# Patient Record
Sex: Male | Born: 1965 | Race: White | Hispanic: No | Marital: Married | Smoking: Never smoker
Health system: Southern US, Community
[De-identification: ages and names within clinical notes are randomized; demographics above are authoritative.]

## PROBLEM LIST (undated history)

## (undated) DIAGNOSIS — N390 Urinary tract infection, site not specified: Secondary | ICD-10-CM

## (undated) DIAGNOSIS — K219 Gastro-esophageal reflux disease without esophagitis: Secondary | ICD-10-CM

## (undated) DIAGNOSIS — R7303 Prediabetes: Secondary | ICD-10-CM

## (undated) DIAGNOSIS — R011 Cardiac murmur, unspecified: Secondary | ICD-10-CM

## (undated) DIAGNOSIS — M1990 Unspecified osteoarthritis, unspecified site: Secondary | ICD-10-CM

## (undated) DIAGNOSIS — E782 Mixed hyperlipidemia: Secondary | ICD-10-CM

## (undated) HISTORY — DX: Urinary tract infection, site not specified: N39.0

## (undated) HISTORY — PX: HYPOSPADIAS CORRECTION: SHX483

## (undated) HISTORY — DX: Mixed hyperlipidemia: E78.2

---

## 2003-04-16 HISTORY — PX: LUMBAR DISC SURGERY: SHX700

## 2003-12-05 ENCOUNTER — Ambulatory Visit (HOSPITAL_COMMUNITY): Admission: RE | Admit: 2003-12-05 | Discharge: 2003-12-06 | Payer: Self-pay | Admitting: Neurosurgery

## 2017-04-12 DIAGNOSIS — J0140 Acute pansinusitis, unspecified: Secondary | ICD-10-CM | POA: Diagnosis not present

## 2017-04-12 DIAGNOSIS — J029 Acute pharyngitis, unspecified: Secondary | ICD-10-CM | POA: Diagnosis not present

## 2017-05-09 DIAGNOSIS — E291 Testicular hypofunction: Secondary | ICD-10-CM | POA: Diagnosis not present

## 2017-05-09 DIAGNOSIS — E782 Mixed hyperlipidemia: Secondary | ICD-10-CM | POA: Diagnosis not present

## 2017-05-19 DIAGNOSIS — N401 Enlarged prostate with lower urinary tract symptoms: Secondary | ICD-10-CM | POA: Diagnosis not present

## 2017-06-04 DIAGNOSIS — J111 Influenza due to unidentified influenza virus with other respiratory manifestations: Secondary | ICD-10-CM | POA: Diagnosis not present

## 2017-06-04 DIAGNOSIS — R509 Fever, unspecified: Secondary | ICD-10-CM | POA: Diagnosis not present

## 2017-07-21 DIAGNOSIS — N390 Urinary tract infection, site not specified: Secondary | ICD-10-CM | POA: Diagnosis not present

## 2017-09-10 DIAGNOSIS — J069 Acute upper respiratory infection, unspecified: Secondary | ICD-10-CM | POA: Diagnosis not present

## 2017-09-10 DIAGNOSIS — N390 Urinary tract infection, site not specified: Secondary | ICD-10-CM | POA: Diagnosis not present

## 2017-10-14 DIAGNOSIS — E291 Testicular hypofunction: Secondary | ICD-10-CM | POA: Diagnosis not present

## 2017-10-14 DIAGNOSIS — Z79899 Other long term (current) drug therapy: Secondary | ICD-10-CM | POA: Diagnosis not present

## 2017-10-14 DIAGNOSIS — E782 Mixed hyperlipidemia: Secondary | ICD-10-CM | POA: Diagnosis not present

## 2017-10-14 DIAGNOSIS — Z125 Encounter for screening for malignant neoplasm of prostate: Secondary | ICD-10-CM | POA: Diagnosis not present

## 2017-11-17 DIAGNOSIS — N3090 Cystitis, unspecified without hematuria: Secondary | ICD-10-CM | POA: Diagnosis not present

## 2018-01-08 DIAGNOSIS — R109 Unspecified abdominal pain: Secondary | ICD-10-CM | POA: Diagnosis not present

## 2018-01-08 DIAGNOSIS — R1084 Generalized abdominal pain: Secondary | ICD-10-CM | POA: Diagnosis not present

## 2018-01-08 DIAGNOSIS — R197 Diarrhea, unspecified: Secondary | ICD-10-CM | POA: Diagnosis not present

## 2018-01-08 DIAGNOSIS — K591 Functional diarrhea: Secondary | ICD-10-CM | POA: Diagnosis not present

## 2018-02-16 DIAGNOSIS — E782 Mixed hyperlipidemia: Secondary | ICD-10-CM | POA: Diagnosis not present

## 2018-02-16 DIAGNOSIS — Z6832 Body mass index (BMI) 32.0-32.9, adult: Secondary | ICD-10-CM | POA: Diagnosis not present

## 2018-02-16 DIAGNOSIS — Z23 Encounter for immunization: Secondary | ICD-10-CM | POA: Diagnosis not present

## 2018-02-16 DIAGNOSIS — E291 Testicular hypofunction: Secondary | ICD-10-CM | POA: Diagnosis not present

## 2018-04-28 DIAGNOSIS — Z79899 Other long term (current) drug therapy: Secondary | ICD-10-CM | POA: Diagnosis not present

## 2018-04-28 DIAGNOSIS — Z6832 Body mass index (BMI) 32.0-32.9, adult: Secondary | ICD-10-CM | POA: Diagnosis not present

## 2018-04-28 DIAGNOSIS — E782 Mixed hyperlipidemia: Secondary | ICD-10-CM | POA: Diagnosis not present

## 2018-04-28 DIAGNOSIS — M545 Low back pain: Secondary | ICD-10-CM | POA: Diagnosis not present

## 2018-06-08 DIAGNOSIS — N401 Enlarged prostate with lower urinary tract symptoms: Secondary | ICD-10-CM | POA: Diagnosis not present

## 2018-06-08 DIAGNOSIS — N3090 Cystitis, unspecified without hematuria: Secondary | ICD-10-CM | POA: Diagnosis not present

## 2018-06-08 DIAGNOSIS — N529 Male erectile dysfunction, unspecified: Secondary | ICD-10-CM | POA: Diagnosis not present

## 2018-08-05 DIAGNOSIS — M7742 Metatarsalgia, left foot: Secondary | ICD-10-CM | POA: Diagnosis not present

## 2018-08-05 DIAGNOSIS — Z6833 Body mass index (BMI) 33.0-33.9, adult: Secondary | ICD-10-CM | POA: Diagnosis not present

## 2019-06-17 ENCOUNTER — Encounter: Payer: Self-pay | Admitting: Cardiology

## 2019-06-17 DIAGNOSIS — E782 Mixed hyperlipidemia: Secondary | ICD-10-CM

## 2019-06-17 DIAGNOSIS — N390 Urinary tract infection, site not specified: Secondary | ICD-10-CM

## 2019-06-18 ENCOUNTER — Ambulatory Visit (INDEPENDENT_AMBULATORY_CARE_PROVIDER_SITE_OTHER): Payer: 59 | Admitting: Cardiology

## 2019-06-18 ENCOUNTER — Encounter: Payer: Self-pay | Admitting: Cardiology

## 2019-06-18 ENCOUNTER — Encounter (INDEPENDENT_AMBULATORY_CARE_PROVIDER_SITE_OTHER): Payer: Self-pay

## 2019-06-18 ENCOUNTER — Other Ambulatory Visit: Payer: Self-pay

## 2019-06-18 DIAGNOSIS — E782 Mixed hyperlipidemia: Secondary | ICD-10-CM

## 2019-06-18 DIAGNOSIS — I2510 Atherosclerotic heart disease of native coronary artery without angina pectoris: Secondary | ICD-10-CM

## 2019-06-18 DIAGNOSIS — R0789 Other chest pain: Secondary | ICD-10-CM

## 2019-06-18 HISTORY — DX: Atherosclerotic heart disease of native coronary artery without angina pectoris: I25.10

## 2019-06-18 NOTE — Patient Instructions (Signed)
Medication Instructions:  Your physician has recommended you make the following change in your medication:   Take 81 mg aspirin daily.  *If you need a refill on your cardiac medications before your next appointment, please call your pharmacy*   Lab Work: You need to have labs done when you are fasting.  You can come Monday through Friday 8:30 am to 12:00 pm and 1:15 to 4:30. You do not need to make an appointment as the order has already been placed. The labs you are going to have done are LFT and Lipids. If you have labs (blood work) drawn today and your tests are completely normal, you will receive your results only by: Marland Kitchen MyChart Message (if you have MyChart) OR . A paper copy in the mail If you have any lab test that is abnormal or we need to change your treatment, we will call you to review the results.   Testing/Procedures: Your physician has requested that you have an echocardiogram. Echocardiography is a painless test that uses sound waves to create images of your heart. It provides your doctor with information about the size and shape of your heart and how well your heart's chambers and valves are working. This procedure takes approximately one hour. There are no restrictions for this procedure. Your physician has requested that you have a lexiscan myoview. For further information please visit [email or web address] Please follow instruction sheet, as given.  Your physician has requested that you have a lexiscan myoview. For further information please visit [email or web address] Please follow instruction sheet, as given.  The test will take approximately 3 to 4 hours to complete; you may bring reading material.  If someone comes with you to your appointment, they will need to remain in the main lobby due to limited space in the testing area. **If you are pregnant or breastfeeding, please notify the nuclear lab prior to your appointment**  How to prepare for your Myocardial Perfusion  Test: . Do not eat or drink 3 hours prior to your test, except you may have water. . Do not consume products containing caffeine (regular or decaffeinated) 12 hours prior to your test. (ex: coffee, chocolate, sodas, tea). . Do bring a list of your current medications with you.  If not listed below, you may take your medications as normal. . Do wear comfortable clothes (no dresses or overalls) and walking shoes, tennis shoes preferred (No heels or open toe shoes are allowed). . Do NOT wear cologne, perfume, aftershave, or lotions (deodorant is allowed). . If these instructions are not followed, your test will have to be rescheduled.    Follow-Up: At [facility], you and your health needs are our priority.  As part of our continuing mission to provide you with exceptional heart care, we have created designated Provider Care Teams.  These Care Teams include your primary Cardiologist (physician) and Advanced Practice Providers (APPs -  Physician Assistants and Nurse Practitioners) who all work together to provide you with the care you need, when you need it.  We recommend signing up for the patient portal called "MyChart".  Sign up information is provided on this After Visit Summary.  MyChart is used to connect with patients for Virtual Visits (Telemedicine).  Patients are able to view lab/test results, encounter notes, upcoming appointments, etc.  Non-urgent messages can be sent to your provider as well.   To learn more about what you can do with MyChart, go to [email or web address].    Your next appointment:   2 month(s)  The format for your next appointment:   In Person  Provider:   Jyl Heinz, MD   Other Instructions NA

## 2019-06-18 NOTE — Progress Notes (Signed)
Cardiology Office Note:    Date:  06/18/2019   ID:  Ethan Chapman, DOB 03/23/1966, MRN [id number]  PCP:  Lise Auer, MD  Cardiologist:  Garwin Brothers, MD   Referring MD: Lise Auer, MD    ASSESSMENT:    1. Mixed hypercholesterolemia and hypertriglyceridemia   2. Atherosclerosis of native coronary artery of native heart without angina pectoris   3. Chest tightness    PLAN:    In order of problems listed above:  1. Coronary atherosclerosis: Secondary prevention stressed with the patient.  Importance of compliance with diet medication stressed and he vocalized understanding.  Weight reduction was stressed diet was explained.  Risks of obesity explained to the patient. 2. Essential hypertension: Blood pressure stable lifestyle modification was urged 3. Mixed dyslipidemia: Patient will be back in the next few days for fasting liver lipid check and I will act according to his numbers.  Diet was explained. 4. He knows to go to the nearest emergency room for any concerning symptoms.  In view of his chest tightness he will undergo Lexiscan sestamibi.  Echocardiogram will be done to assess murmur heard on auscultation. 5. Patient will be seen in follow-up appointment in 2 months or earlier if the patient has any concerns    Medication Adjustments/Labs and Tests Ordered: Current medicines are reviewed at length with the patient today.  Concerns regarding medicines are outlined above.  Orders Placed This Encounter  Procedures  . MYOCARDIAL PERFUSION IMAGING  . EKG 12-Lead  . ECHOCARDIOGRAM COMPLETE   No orders of the defined types were placed in this encounter.    History of Present Illness:    Ethan Chapman is a 54 y.o. male who is being seen today for the evaluation of chest tightness and coronary atherosclerosis found on CT scan at the request of Lise Auer, MD.  Patient is a pleasant 54 year old male.  He has past medical history of mixed dyslipidemia.  He is  overweight and leads a sedentary lifestyle.  He mentions to me that he occasionally has chest tightness and has come to the emergency room for the same reason.  No orthopnea or PND.  CT scanning revealed atherosclerosis of the aorta and the coronary arteries.  Again patient denies any orthopnea or PND.  He has chest pain chest tightness.  This may or may not be related to exertion.  It does not come on with sexual activity.  At the time of my evaluation, the patient is alert awake oriented and in no distress.  Past Medical History:  Diagnosis Date  . Mixed hypercholesterolemia and hypertriglyceridemia   . Recurrent UTI     Past Surgical History:  Procedure Laterality Date  . HYPOSPADIAS CORRECTION    . LUMBAR DISC SURGERY  2005   L4-5 left sded microdiscectomy    Current Medications: Current Meds  Medication Sig  . Cinnamon 500 MG capsule Take 500 mg by mouth daily.  . nitrofurantoin, macrocrystal-monohydrate, (MACROBID) 100 MG capsule Take 100 mg by mouth daily.  . nitroGLYCERIN (NITROSTAT) 0.4 MG SL tablet   . omega-3 acid ethyl esters (LOVAZA) 1 g capsule Take 1 g by mouth 2 (two) times daily.  Marland Kitchen omeprazole (PRILOSEC) 40 MG capsule Take 40 mg by mouth daily.  . tadalafil (CIALIS) 5 MG tablet Take 5 mg by mouth as needed.  . testosterone enanthate (DELATESTRYL) 200 MG/ML injection Inject into the muscle.     Allergies:   Naproxen   Social History  Socioeconomic History  . Marital status: Single    Spouse name: Not on file  . Number of children: Not on file  . Years of education: Not on file  . Highest education level: Not on file  Occupational History  . Not on file  Tobacco Use  . Smoking status: Never Smoker  . Smokeless tobacco: Former Network engineer and Sexual Activity  . Alcohol use: Yes    Comment: Rare, special occasions  . Drug use: Never  . Sexual activity: Not on file  Other Topics Concern  . Not on file  Social History Narrative  . Not on file    Social Determinants of Health   Financial Resource Strain:   . Difficulty of Paying Living Expenses: Not on file  Food Insecurity:   . Worried About Charity fundraiser in the Last Year: Not on file  . Ran Out of Food in the Last Year: Not on file  Transportation Needs:   . Lack of Transportation (Medical): Not on file  . Lack of Transportation (Non-Medical): Not on file  Physical Activity:   . Days of Exercise per Week: Not on file  . Minutes of Exercise per Session: Not on file  Stress:   . Feeling of Stress : Not on file  Social Connections:   . Frequency of Communication with Friends and Family: Not on file  . Frequency of Social Gatherings with Friends and Family: Not on file  . Attends Religious Services: Not on file  . Active Member of Clubs or Organizations: Not on file  . Attends Archivist Meetings: Not on file  . Marital Status: Not on file     Family History: The patient's family history includes Dementia in his mother; Hypertension in his father.  ROS:   Please see the history of present illness.    All other systems reviewed and are negative.  EKGs/Labs/Other Studies Reviewed:    The following studies were reviewed today: EKG reveals sinus rhythm and nonspecific ST-T changes   Recent Labs: No results found for requested labs within last 8760 hours.  Recent Lipid Panel No results found for: CHOL, TRIG, HDL, CHOLHDL, VLDL, LDLCALC, LDLDIRECT  Physical Exam:    VS:  BP (!) 140/98   Pulse 79   Ht 6\' 1"  (1.854 m)   Wt 254 lb (115.2 kg)   SpO2 96%   BMI 33.51 kg/m     Wt Readings from Last 3 Encounters:  06/18/19 254 lb (115.2 kg)  05/10/19 257 lb (116.6 kg)     GEN: Patient is in no acute distress HEENT: Normal NECK: No JVD; No carotid bruits LYMPHATICS: No lymphadenopathy CARDIAC: S1 S2 regular, 2/6 systolic murmur at the apex. RESPIRATORY:  Clear to auscultation without rales, wheezing or rhonchi  ABDOMEN: Soft, non-tender,  non-distended MUSCULOSKELETAL:  No edema; No deformity  SKIN: Warm and dry NEUROLOGIC:  Alert and oriented x 3 PSYCHIATRIC:  Normal affect    Signed, Jenean Lindau, MD  06/18/2019 12:05 PM    [place]

## 2019-06-21 LAB — HEPATIC FUNCTION PANEL
ALT: 52 IU/L — ABNORMAL HIGH (ref 0–44)
AST: 27 IU/L (ref 0–40)
Albumin: 4.3 g/dL (ref 3.8–4.9)
Alkaline Phosphatase: 65 IU/L (ref 39–117)
Bilirubin Total: 0.5 mg/dL (ref 0.0–1.2)
Bilirubin, Direct: 0.13 mg/dL (ref 0.00–0.40)
Total Protein: 6.8 g/dL (ref 6.0–8.5)

## 2019-06-21 LAB — LIPID PANEL
Chol/HDL Ratio: 7 ratio — ABNORMAL HIGH (ref 0.0–5.0)
Cholesterol, Total: 225 mg/dL — ABNORMAL HIGH (ref 100–199)
HDL: 32 mg/dL — ABNORMAL LOW (ref >39)
LDL Chol Calc (NIH): 124 mg/dL — ABNORMAL HIGH (ref 0–99)
Triglycerides: 390 mg/dL — ABNORMAL HIGH (ref 0–149)
VLDL Cholesterol Cal: 69 mg/dL — ABNORMAL HIGH (ref 5–40)

## 2019-06-25 ENCOUNTER — Other Ambulatory Visit: Payer: Self-pay

## 2019-06-25 DIAGNOSIS — E782 Mixed hyperlipidemia: Secondary | ICD-10-CM

## 2019-06-25 MED ORDER — ROSUVASTATIN CALCIUM 10 MG PO TABS: 10.0000 mg | ORAL_TABLET | Freq: Every day | ORAL | 3 refills | Status: DC

## 2019-06-30 ENCOUNTER — Telehealth (HOSPITAL_COMMUNITY): Payer: Self-pay | Admitting: *Deleted

## 2019-06-30 NOTE — Telephone Encounter (Signed)
Patient given detailed instructions per Myocardial Perfusion Study Information Sheet for the test on 07/08/19 at 8:15. Patient notified to arrive 15 minutes early and that it is imperative to arrive on time for appointment to keep from having the test rescheduled.  If you need to cancel or reschedule your appointment, please call the office within 24 hours of your appointment. . Patient verbalized understanding.Ethan Chapman

## 2019-07-05 NOTE — Telephone Encounter (Signed)
Patient returning call.

## 2019-07-12 ENCOUNTER — Telehealth: Payer: Self-pay | Admitting: Cardiology

## 2019-07-12 DIAGNOSIS — I2510 Atherosclerotic heart disease of native coronary artery without angina pectoris: Secondary | ICD-10-CM

## 2019-07-12 NOTE — Telephone Encounter (Signed)
Will route this request to our billing/pre-cert dept and Supervisor, as well as to Dr. Tomie China and his RN for further review and follow-up with [facility].

## 2019-07-12 NOTE — Telephone Encounter (Signed)
Patient called and needs Dr. Tomie China to submit a new claim to the insurance company for the stress test since both attempts have been denied.  The office will need to call [employer] at [phone]. The next step may be to set up a peer to peer with Dr. Tomie China and the insurance company.  Please let the patient know if there is anything else he will need to do

## 2019-07-12 NOTE — Telephone Encounter (Signed)
Pl attend to this

## 2019-07-13 NOTE — Telephone Encounter (Signed)
Called [employer] regarding a new claim for the denied stress test and was told that the first denial was in 05/28/19. The [employer] rep states that a new claim maybe filed 45 day after the initial denial. The next time a claim maybe initiated will be 07/19/19.  I called and spoke with the pt and made him aware of the update.

## 2019-07-23 ENCOUNTER — Encounter: Payer: Self-pay | Admitting: Dietician

## 2019-07-23 ENCOUNTER — Other Ambulatory Visit: Payer: Self-pay

## 2019-07-23 ENCOUNTER — Encounter: Payer: 59 | Attending: Cardiology | Admitting: Dietician

## 2019-07-23 DIAGNOSIS — E782 Mixed hyperlipidemia: Secondary | ICD-10-CM | POA: Diagnosis present

## 2019-07-23 NOTE — Patient Instructions (Addendum)
Aim to have more water, especially while at work.   For breakfast, the grilled chicken/egg english muffin is a great option! As well as oatmeal with berries and chopped nuts or another source of protein.   Focus on the whole grains (instead of white or refined), such as whole wheat bread/wraps, brown rice, oatmeal, corn, etc.

## 2019-07-23 NOTE — Progress Notes (Signed)
Medical Nutrition Therapy  Appt Start Time: 7:50am    End Time: 8:50am  Primary concerns today: high triglycerides/cholesterol   Referral diagnosis: E78.2- mixed hypercholesterolemia and hypertriglyceridemia  Preferred learning style: no preference indicated Learning readiness: change in progress   NUTRITION ASSESSMENT   Anthropometrics  Weight: 251.7 lbs    Lifestyle & Dietary Hx Patient states he and his wife have been trying to eat better over the past ~3 months. Tried the I-Track Bites app to track food intake, and states he learned from this based on the points system used for foods. In general, tries to avoid bread, pasta, fries, red meat, sweets. Common foods include deer meat, chicken, steamed cauliflower, broccoli, carrots, oranges, apples. Eats breakfast 50% of the time, and may have eggs and bacon, or grilled chicken english muffin. States he used to eat cereal with milk and a spoonful of sugar, or a biscuit for breakfast. Will eat out for lunch about 50% of the time, otherwise will bring lunch from home. Will eat out sometimes for dinner, such as a chicken wrap with fries or [place] fajitas. Other meals include grilled chicken with vegetables or steak with cauliflower and broccoli. States he does not eat as much fried foods or starchy foods anymore such as bread, but if does eat starch it is typically white/refined (such as white rice and white bread.)   Estimated daily fluid intake: 2 cups coffee + 32 oz water  Supplements: cinnamon, vitamin C, prescription omega 3  Sleep: 7-8 hours, sleeps well Stress / self-care: stressful job Current average weekly physical activity: walking daily on lunch break   24-Hr Dietary Recall First Meal: grilled chicken english muffin  Snack: apple  Second Meal: tuna salad plate (or grilled chicken salad)  Snack: carrot  Third Meal: hot dog + sauerkraut + beans  Snack: no-sugar chocolate fudge ice cream Beverages: water, diet Lipton green tea,  coffee, rarely diet Dr. Reino Kent or Cheerwine   NUTRITION DIAGNOSIS  Inadequate fiber intake (NI-5.8.5) related to reduced carbohydrate-containing food intake as evidenced by patient reported dietary hx reflecting estimated low intakes of fiber-containing foods.    NUTRITION INTERVENTION  Nutrition education (E-1) on the following topics:  . Heart Healthy Nutrition   Handouts Provided Include   High Triglyceride Nutrition Therapy   MyPlate Portions  Learning Style & Readiness for Change Teaching method utilized: Visual & Auditory  Demonstrated degree of understanding via: Teach Back  Barriers to learning/adherence to lifestyle change: None Identified   Goals Established by Pt . Aim to have more water, especially while at work  . Try to always eat breakfast and include a source of protein  . Focus on whole grains (instead of white/refined)    MONITORING & EVALUATION Dietary intake, weekly physical activity, and goals prn.  Next Steps  Patient is to contact [facility] to schedule follow up visit as needed/desired.

## 2019-07-26 ENCOUNTER — Other Ambulatory Visit: Payer: Self-pay

## 2019-07-26 ENCOUNTER — Ambulatory Visit (INDEPENDENT_AMBULATORY_CARE_PROVIDER_SITE_OTHER): Payer: 59

## 2019-07-26 DIAGNOSIS — E782 Mixed hyperlipidemia: Secondary | ICD-10-CM

## 2019-07-26 DIAGNOSIS — R0789 Other chest pain: Secondary | ICD-10-CM

## 2019-07-26 LAB — BASIC METABOLIC PANEL
BUN/Creatinine Ratio: 10 (ref 9–20)
BUN: 9 mg/dL (ref 6–24)
CO2: 21 mmol/L (ref 20–29)
Calcium: 8.5 mg/dL — ABNORMAL LOW (ref 8.7–10.2)
Chloride: 104 mmol/L (ref 96–106)
Creatinine, Ser: 0.89 mg/dL (ref 0.76–1.27)
GFR calc Af Amer: 113 mL/min/{1.73_m2} (ref >59)
GFR calc non Af Amer: 98 mL/min/{1.73_m2} (ref >59)
Glucose: 120 mg/dL — ABNORMAL HIGH (ref 65–99)
Potassium: 4.4 mmol/L (ref 3.5–5.2)
Sodium: 139 mmol/L (ref 134–144)

## 2019-07-26 LAB — HEPATIC FUNCTION PANEL
ALT: 42 IU/L (ref 0–44)
AST: 24 IU/L (ref 0–40)
Albumin: 4 g/dL (ref 3.8–4.9)
Alkaline Phosphatase: 68 IU/L (ref 39–117)
Bilirubin Total: 0.6 mg/dL (ref 0.0–1.2)
Bilirubin, Direct: 0.17 mg/dL (ref 0.00–0.40)
Total Protein: 6.2 g/dL (ref 6.0–8.5)

## 2019-07-26 NOTE — Progress Notes (Signed)
Complete echocardiogram has been performed.  Jimmy Taos Tapp RDCS, RVT 

## 2019-07-28 ENCOUNTER — Telehealth: Payer: Self-pay | Admitting: Emergency Medicine

## 2019-07-28 NOTE — Telephone Encounter (Signed)
Patient informed of Dr. Kem Parkinson recommendation. He has still not be scheduled for stress test he said he was told there was a issue with his insurance. I will check with him and his nurse for more information.

## 2019-07-28 NOTE — Telephone Encounter (Signed)
Patient informed of lab results.   During call patient asking if he can go back on cialis. Will consult with Dr. Tomie China.

## 2019-07-28 NOTE — Telephone Encounter (Signed)
Left message for patient to return call regarding results  

## 2019-07-28 NOTE — Telephone Encounter (Signed)
Better to wait till stress test is done

## 2019-07-28 NOTE — Telephone Encounter (Signed)
Please find out what the issue with the insurance was.  Thank you

## 2019-07-28 NOTE — Telephone Encounter (Signed)
Patient returning Hayley's call.  

## 2019-07-29 NOTE — Addendum Note (Signed)
Addended by: Eleonore Chiquito on: 07/29/2019 08:58 AM   Modules accepted: Orders

## 2019-08-11 ENCOUNTER — Telehealth (HOSPITAL_COMMUNITY): Payer: Self-pay | Admitting: *Deleted

## 2019-08-11 NOTE — Telephone Encounter (Signed)
Patient given detailed instructions per Myocardial Perfusion Study Information Sheet for the test on 08/18/19 at 8:15. Patient notified to arrive 15 minutes early and that it is imperative to arrive on time for appointment to keep from having the test rescheduled.  If you need to cancel or reschedule your appointment, please call the office within 24 hours of your appointment. . Patient verbalized understanding.Daneil Dolin

## 2019-08-11 NOTE — Telephone Encounter (Signed)
Can you schedule this pt a lexiscan? He has now been approved. Thank you

## 2019-08-18 ENCOUNTER — Other Ambulatory Visit: Payer: Self-pay

## 2019-08-18 ENCOUNTER — Ambulatory Visit (INDEPENDENT_AMBULATORY_CARE_PROVIDER_SITE_OTHER): Payer: 59

## 2019-08-18 VITALS — Ht 73.0 in | Wt 254.0 lb

## 2019-08-18 DIAGNOSIS — I2510 Atherosclerotic heart disease of native coronary artery without angina pectoris: Secondary | ICD-10-CM | POA: Diagnosis not present

## 2019-08-18 DIAGNOSIS — R0789 Other chest pain: Secondary | ICD-10-CM

## 2019-08-18 LAB — MYOCARDIAL PERFUSION IMAGING
LV dias vol: 132 mL (ref 62–150)
LV sys vol: 72 mL
Peak HR: 113 {beats}/min
Rest HR: 76 {beats}/min
SDS: 3
SRS: 0
SSS: 3
TID: 1.16

## 2019-08-18 MED ORDER — REGADENOSON 0.4 MG/5ML IV SOLN
0.4000 mg | Freq: Once | INTRAVENOUS | Status: AC
Start: 2019-08-18 — End: 2019-08-18
  Administered 2019-08-18: 0.4 mg via INTRAVENOUS

## 2019-08-18 MED ORDER — TECHNETIUM TC 99M TETROFOSMIN IV KIT
31.6000 | PACK | Freq: Once | INTRAVENOUS | Status: AC | PRN
  Administered 2019-08-18: 31.6 via INTRAVENOUS

## 2019-08-18 MED ORDER — TECHNETIUM TC 99M TETROFOSMIN IV KIT
9.8000 | PACK | Freq: Once | INTRAVENOUS | Status: AC | PRN
  Administered 2019-08-18: 9.8 via INTRAVENOUS

## 2019-08-18 MED ORDER — AMINOPHYLLINE 25 MG/ML IV SOLN
75.0000 mg | Freq: Once | INTRAVENOUS | Status: AC
  Administered 2019-08-18: 75 mg via INTRAVENOUS

## 2019-08-23 ENCOUNTER — Other Ambulatory Visit: Payer: Self-pay

## 2019-08-24 ENCOUNTER — Ambulatory Visit (INDEPENDENT_AMBULATORY_CARE_PROVIDER_SITE_OTHER): Payer: 59 | Admitting: Cardiology

## 2019-08-24 ENCOUNTER — Other Ambulatory Visit: Payer: Self-pay

## 2019-08-24 ENCOUNTER — Encounter: Payer: Self-pay | Admitting: Cardiology

## 2019-08-24 VITALS — BP 158/92 | HR 88 | Ht 73.0 in | Wt 255.0 lb

## 2019-08-24 DIAGNOSIS — E782 Mixed hyperlipidemia: Secondary | ICD-10-CM | POA: Diagnosis not present

## 2019-08-24 DIAGNOSIS — I2510 Atherosclerotic heart disease of native coronary artery without angina pectoris: Secondary | ICD-10-CM

## 2019-08-24 DIAGNOSIS — R9439 Abnormal result of other cardiovascular function study: Secondary | ICD-10-CM

## 2019-08-24 HISTORY — DX: Abnormal result of other cardiovascular function study: R94.39

## 2019-08-24 MED ORDER — METOPROLOL SUCCINATE ER 50 MG PO TB24: 50.0000 mg | ORAL_TABLET | Freq: Every day | ORAL | 3 refills | Status: DC

## 2019-08-24 NOTE — Progress Notes (Signed)
Cardiology Office Note:    Date:  08/24/2019   ID:  Ethan Chapman, DOB July 09, 1965, MRN [id number]  PCP:  Ethan Auer, MD  Cardiologist:  Ethan Brothers, MD   Referring MD: Ethan Auer, MD    ASSESSMENT:    1. Atherosclerosis of native coronary artery of native heart without angina pectoris   2. Mixed hypercholesterolemia and hypertriglyceridemia   3. Abnormal nuclear stress test    PLAN:    In order of problems listed above:  1. Coronary artery disease: Abnormal stress test: Secondary prevention stressed with the patient.  Importance of compliance with diet and medication stressed and he vocalized understanding.  His effort tolerance is excellent so he will continue his current medications.  I gave him an option of coronary angiography conventional and with CT scan and he opts for the latter.  We will set this up for him.  He is taking aspirin coated 81 mg daily.  He was advised to continue walking regularly.  Results of stress test and echocardiogram were discussed with him at length. 2. Essential hypertension: Blood pressures continues to be elevated and in light of above findings I started him on Toprol-XL 50 mg daily. 3. Mixed dyslipidemia: Diet was emphasized he will be back in 2 to 3 weeks for follow-up appointment before which he will get blood work including Chem-7 liver lipid check. 4. He knows to go to the nearest emergency room for any concerning symptoms.   Medication Adjustments/Labs and Tests Ordered: Current medicines are reviewed at length with the patient today.  Concerns regarding medicines are outlined above.  No orders of the defined types were placed in this encounter.  No orders of the defined types were placed in this encounter.    Chief Complaint  Patient presents with  . Follow-up     History of Present Illness:    Ethan Chapman is a 54 y.o. male.  Patient has past medical history of essential hypertension, coronary atherosclerosis and  mixed dyslipidemia.  He is on statin therapy.  He mentions to me that he walks 2 miles a day without any problems.  His echocardiogram is unremarkable but his stress test revealed evidence of ischemia and the details are mentioned below.  It appears to be low risk scan.  He denies any chest pain orthopnea or PND.  At the time of my evaluation, the patient is alert awake oriented and in no distress.  Past Medical History:  Diagnosis Date  . Coronary atherosclerosis 06/18/2019  . Mixed hypercholesterolemia and hypertriglyceridemia   . Recurrent UTI     Past Surgical History:  Procedure Laterality Date  . HYPOSPADIAS CORRECTION    . LUMBAR DISC SURGERY  2005   L4-5 left sded microdiscectomy    Current Medications: Current Meds  Medication Sig  . Cinnamon 500 MG capsule Take 500 mg by mouth daily.  . cyclobenzaprine (FLEXERIL) 5 MG tablet Take 5 mg by mouth 3 (three) times daily as needed for muscle spasms.  . nitrofurantoin, macrocrystal-monohydrate, (MACROBID) 100 MG capsule Take 100 mg by mouth daily.  . nitroGLYCERIN (NITROSTAT) 0.4 MG SL tablet   . omega-3 acid ethyl esters (LOVAZA) 1 g capsule Take 1 g by mouth 2 (two) times daily.  Marland Kitchen omeprazole (PRILOSEC) 40 MG capsule Take 40 mg by mouth daily.  . rosuvastatin (CRESTOR) 10 MG tablet Take 1 tablet (10 mg total) by mouth daily.  Marland Kitchen testosterone enanthate (DELATESTRYL) 200 MG/ML injection Inject into the muscle.  Marland Kitchen  traMADol (ULTRAM) 50 MG tablet Take 50 mg by mouth 3 (three) times daily as needed.     Allergies:   Naproxen   Social History   Socioeconomic History  . Marital status: Single    Spouse name: Not on file  . Number of children: Not on file  . Years of education: Not on file  . Highest education level: Not on file  Occupational History  . Not on file  Tobacco Use  . Smoking status: Never Smoker  . Smokeless tobacco: Former Engineer, water and Sexual Activity  . Alcohol use: Yes    Comment: Rare, special  occasions  . Drug use: Never  . Sexual activity: Not on file  Other Topics Concern  . Not on file  Social History Narrative  . Not on file   Social Determinants of Health   Financial Resource Strain:   . Difficulty of Paying Living Expenses:   Food Insecurity:   . Worried About Programme researcher, broadcasting/film/video in the Last Year:   . Barista in the Last Year:   Transportation Needs:   . Freight forwarder (Medical):   Marland Kitchen Lack of Transportation (Non-Medical):   Physical Activity:   . Days of Exercise per Week:   . Minutes of Exercise per Session:   Stress:   . Feeling of Stress :   Social Connections:   . Frequency of Communication with Friends and Family:   . Frequency of Social Gatherings with Friends and Family:   . Attends Religious Services:   . Active Member of Clubs or Organizations:   . Attends Banker Meetings:   Marland Kitchen Marital Status:      Family History: The patient's family history includes Dementia in his mother; Hypertension in his father.  ROS:   Please see the history of present illness.    All other systems reviewed and are negative.  EKGs/Labs/Other Studies Reviewed:    The following studies were reviewed today: tudy Highlights   Nuclear stress EF: 45%.  The left ventricular ejection fraction is mildly decreased (45-54%).  There was no ST segment deviation noted during stress.  Defect 1: There is a small defect of mild severity present in the basal inferior and mid inferior location.  Findings consistent with ischemia.  This is an intermediate risk study.  Mild ischemia involving basal and mid portion of the inferior wall.  Mildly reduced EF    IMPRESSIONS    1. Left ventricular ejection fraction, by estimation, is 55 to 60%. The  left ventricle has normal function. The left ventricle has no regional  wall motion abnormalities. There is mild concentric left ventricular  hypertrophy. Left ventricular diastolic  parameters were  normal.  2. Right ventricular systolic function is normal. The right ventricular  size is normal. There is normal pulmonary artery systolic pressure.  3. The mitral valve is normal in structure. Trivial mitral valve  regurgitation. No evidence of mitral stenosis.  4. The aortic valve is normal in structure. Aortic valve regurgitation is  not visualized. No aortic stenosis is present.  5. The inferior vena cava is normal in size with greater than 50%  respiratory variability, suggesting right atrial pressure of 3 mmHg.   Recent Labs: 07/26/2019: ALT 42; BUN 9; Creatinine, Ser 0.89; Potassium 4.4; Sodium 139  Recent Lipid Panel    Component Value Date/Time   CHOL 225 (H) 06/21/2019 0807   TRIG 390 (H) 06/21/2019 0807   HDL 32 (L)  06/21/2019 0807   CHOLHDL 7.0 (H) 06/21/2019 0807   LDLCALC 124 (H) 06/21/2019 0807    Physical Exam:    VS:  BP (!) 158/92   Pulse 88   Ht 6\' 1"  (1.854 m)   Wt 255 lb (115.7 kg)   SpO2 98%   BMI 33.64 kg/m     Wt Readings from Last 3 Encounters:  08/24/19 255 lb (115.7 kg)  08/18/19 254 lb (115.2 kg)  07/23/19 251 lb 11.2 oz (114.2 kg)     GEN: Patient is in no acute distress HEENT: Normal NECK: No JVD; No carotid bruits LYMPHATICS: No lymphadenopathy CARDIAC: Hear sounds regular, 2/6 systolic murmur at the apex. RESPIRATORY:  Clear to auscultation without rales, wheezing or rhonchi  ABDOMEN: Soft, non-tender, non-distended MUSCULOSKELETAL:  No edema; No deformity  SKIN: Warm and dry NEUROLOGIC:  Alert and oriented x 3 PSYCHIATRIC:  Normal affect   Signed, Jenean Lindau, MD  08/24/2019 4:17 PM    [facility]

## 2019-08-24 NOTE — Patient Instructions (Addendum)
Medication Instructions:  Your physician has recommended you make the following change in your medication:   Start Toprol 50 mg daily.   *If you need a refill on your cardiac medications before your next appointment, please call your pharmacy*   Lab Work: Your physician recommends that you return for lab work in: 2 weeks.  You need to have labs done when you are fasting.  You can come Monday through Friday 8:30 am to 12:00 pm and 1:15 to 4:30. You do not need to make an appointment as the order has already been placed. The labs you are going to have done are BMET,LFT and Lipids.   If you have labs (blood work) drawn today and your tests are completely normal, you will receive your results only by: Marland Kitchen MyChart Message (if you have MyChart) OR . A paper copy in the mail If you have any lab test that is abnormal or we need to change your treatment, we will call you to review the results.   Testing/Procedures: Your cardiac CT will be scheduled at:   [facility] [address] [place], [place] [address] [phone]   [facility], please arrive at the [facility] main entrance of [facility] 30 minutes prior to test start time. Proceed to the [facility] Radiology Department (first floor) to check-in and test prep.   Please follow these instructions carefully (unless otherwise directed):  Hold all erectile dysfunction medications at least 3 days (72 hrs) prior to test.  On the Night Before the Test: . Be sure to Drink plenty of water. . Do not consume any caffeinated/decaffeinated beverages or chocolate 12 hours prior to your test. . Do not take any antihistamines 12 hours prior to your test.    On the Day of the Test: . Drink plenty of water. Do not drink any water within one hour of the test. . Do not eat any food 4 hours prior to the test. . You may take your regular medications prior to the test.  . Take metoprolol (Lopressor) two hours prior  to test.   After the Test: . Drink plenty of water. . After receiving IV contrast, you may experience a mild flushed feeling. This is normal. . On occasion, you may experience a mild rash up to 24 hours after the test. This is not dangerous. If this occurs, you can take Benadryl 25 mg and increase your fluid intake. . If you experience trouble breathing, this can be serious. If it is severe call 911 IMMEDIATELY. If it is mild, please call our office. . If you take any of these medications: Glipizide/Metformin, Avandament, Glucavance, please do not take 48 hours after completing test unless otherwise instructed.   Once we have confirmed authorization from your insurance company, we will call you to set up a date and time for your test.   For non-scheduling related questions, please contact the cardiac imaging nurse navigator should you have any questions/concerns: Rockwell Alexandria, RN Navigator Cardiac Imaging [facility] [phone] office  For scheduling needs, including cancellations and rescheduling, please call [phone].      Follow-Up: At [facility], you and your health needs are our priority.  As part of our continuing mission to provide you with exceptional heart care, we have created designated Provider Care Teams.  These Care Teams include your primary Cardiologist (physician) and Advanced Practice Providers (APPs -  Physician Assistants and Nurse Practitioners) who all work together to  provide you with the care you need, when you need it.  We recommend signing up for the patient portal called "MyChart".  Sign up information is provided on this After Visit Summary.  MyChart is used to connect with patients for Virtual Visits (Telemedicine).  Patients are able to view lab/test results, encounter notes, upcoming appointments, etc.  Non-urgent messages can be sent to your provider as well.   To learn more about what you can do with MyChart, go to  [email or web address].    Your next appointment:   3 week(s)  The format for your next appointment:   In Person  Provider:   Belva Crome, MD   Other Instructions Metoprolol Extended-Release Tablets What is this medicine? METOPROLOL (me TOE proe lole) is a beta blocker. It decreases the amount of work your heart has to do and helps your heart beat regularly. It treats high blood pressure and/or prevent chest pain (also called angina). It also treats heart failure. This medicine may be used for other purposes; ask your health care provider or pharmacist if you have questions. COMMON BRAND NAME(S): toprol, Toprol XL What should I tell my health care provider before I take this medicine? They need to know if you have any of these conditions:  diabetes  heart or vessel disease like slow heart rate, worsening heart failure, heart block, sick sinus syndrome or Raynaud's disease  kidney disease  liver disease  lung or breathing disease, like asthma or emphysema  pheochromocytoma  thyroid disease  an unusual or allergic reaction to metoprolol, other beta-blockers, medicines, foods, dyes, or preservatives  pregnant or trying to get pregnant  breast-feeding How should I use this medicine? Take this drug by mouth. Take it as directed on the prescription label at the same time every day. Take it with food. You may cut the tablet in half if it is scored (has a line in the middle of it). This may help you swallow the tablet if the whole tablet is too big. Be sure to take both halves. Do not take just one-half of the tablet. Keep taking it unless your health care provider tells you to stop. Talk to your health care provider about the use of this drug in children. While it may be prescribed for children as young as 6 for selected conditions, precautions do apply. Overdosage: If you think you have taken too much of this medicine contact a poison control center or emergency room at  once. NOTE: This medicine is only for you. Do not share this medicine with others. What if I miss a dose? If you miss a dose, take it as soon as you can. If it is almost time for your next dose, take only that dose. Do not take double or extra doses. What may interact with this medicine? This medicine may interact with the following medications:  certain medicines for blood pressure, heart disease, irregular heart beat  certain medicines for depression, like monoamine oxidase (MAO) inhibitors, fluoxetine, or paroxetine  clonidine  dobutamine  epinephrine  isoproterenol  reserpine This list may not describe all possible interactions. Give your health care provider a list of all the medicines, herbs, non-prescription drugs, or dietary supplements you use. Also tell them if you smoke, drink alcohol, or use illegal drugs. Some items may interact with your medicine. What should I watch for while using this medicine? Visit your doctor or health care professional for regular check ups. Contact your doctor right away if your symptoms worsen. Check  your blood pressure and pulse rate regularly. Ask your health care professional what your blood pressure and pulse rate should be, and when you should contact them. You may get drowsy or dizzy. Do not drive, use machinery, or do anything that needs mental alertness until you know how this medicine affects you. Do not sit or stand up quickly, especially if you are an older patient. This reduces the risk of dizzy or fainting spells. Contact your doctor if these symptoms continue. Alcohol may interfere with the effect of this medicine. Avoid alcoholic drinks. This medicine may increase blood sugar. Ask your healthcare provider if changes in diet or medicines are needed if you have diabetes. What side effects may I notice from receiving this medicine? Side effects that you should report to your doctor or health care professional as soon as  possible:  allergic reactions like skin rash, itching or hives  cold or numb hands or feet  depression  difficulty breathing  faint  fever with sore throat  irregular heartbeat, chest pain  rapid weight gain   signs and symptoms of high blood sugar such as being more thirsty or hungry or having to urinate more than normal. You may also feel very tired or have blurry vision.  swollen legs or ankles Side effects that usually do not require medical attention (report to your doctor or health care professional if they continue or are bothersome):  anxiety or nervousness  change in sex drive or performance  dry skin  headache  nightmares or trouble sleeping  short term memory loss  stomach upset or diarrhea This list may not describe all possible side effects. Call your doctor for medical advice about side effects. You may report side effects to FDA at 1-800-FDA-1088. Where should I keep my medicine? Keep out of the reach of children and pets. Store at room temperature between 20 and 25 degrees C (68 and 77 degrees F). Throw away any unused drug after the expiration date. NOTE: This sheet is a summary. It may not cover all possible information. If you have questions about this medicine, talk to your doctor, pharmacist, or health care provider.  2020 Elsevier/Gold Standard (2018-11-12 18:23:00)

## 2019-09-08 LAB — LIPID PANEL
Chol/HDL Ratio: 4.3 ratio (ref 0.0–5.0)
Cholesterol, Total: 154 mg/dL (ref 100–199)
HDL: 36 mg/dL — ABNORMAL LOW (ref >39)
LDL Chol Calc (NIH): 75 mg/dL (ref 0–99)
Triglycerides: 263 mg/dL — ABNORMAL HIGH (ref 0–149)
VLDL Cholesterol Cal: 43 mg/dL — ABNORMAL HIGH (ref 5–40)

## 2019-09-08 LAB — BASIC METABOLIC PANEL
BUN/Creatinine Ratio: 13 (ref 9–20)
BUN: 12 mg/dL (ref 6–24)
CO2: 20 mmol/L (ref 20–29)
Calcium: 9.3 mg/dL (ref 8.7–10.2)
Chloride: 100 mmol/L (ref 96–106)
Creatinine, Ser: 0.93 mg/dL (ref 0.76–1.27)
GFR calc Af Amer: 108 mL/min/{1.73_m2} (ref >59)
GFR calc non Af Amer: 93 mL/min/{1.73_m2} (ref >59)
Glucose: 138 mg/dL — ABNORMAL HIGH (ref 65–99)
Potassium: 4.7 mmol/L (ref 3.5–5.2)
Sodium: 135 mmol/L (ref 134–144)

## 2019-09-08 LAB — HEPATIC FUNCTION PANEL
ALT: 50 IU/L — ABNORMAL HIGH (ref 0–44)
AST: 22 IU/L (ref 0–40)
Albumin: 4.2 g/dL (ref 3.8–4.9)
Alkaline Phosphatase: 71 IU/L (ref 48–121)
Bilirubin Total: 0.6 mg/dL (ref 0.0–1.2)
Bilirubin, Direct: 0.15 mg/dL (ref 0.00–0.40)
Total Protein: 6.7 g/dL (ref 6.0–8.5)

## 2019-09-09 DIAGNOSIS — Z79899 Other long term (current) drug therapy: Secondary | ICD-10-CM

## 2019-09-09 MED ORDER — ICOSAPENT ETHYL 1 G PO CAPS: 2.0000 g | ORAL_CAPSULE | Freq: Two times a day (BID) | ORAL | 12 refills | Status: DC

## 2019-09-10 NOTE — Addendum Note (Signed)
Addended by: Eleonore Chiquito on: 09/10/2019 08:44 AM   Modules accepted: Orders

## 2019-09-14 ENCOUNTER — Ambulatory Visit: Payer: 59 | Admitting: Cardiology

## 2019-09-14 ENCOUNTER — Encounter: Payer: Self-pay | Admitting: Cardiology

## 2019-09-14 ENCOUNTER — Ambulatory Visit (INDEPENDENT_AMBULATORY_CARE_PROVIDER_SITE_OTHER): Payer: 59 | Admitting: Cardiology

## 2019-09-14 ENCOUNTER — Other Ambulatory Visit: Payer: Self-pay

## 2019-09-14 VITALS — BP 130/80 | HR 68 | Ht 73.0 in | Wt 252.0 lb

## 2019-09-14 DIAGNOSIS — I2510 Atherosclerotic heart disease of native coronary artery without angina pectoris: Secondary | ICD-10-CM

## 2019-09-14 DIAGNOSIS — E782 Mixed hyperlipidemia: Secondary | ICD-10-CM | POA: Diagnosis not present

## 2019-09-14 DIAGNOSIS — R9439 Abnormal result of other cardiovascular function study: Secondary | ICD-10-CM

## 2019-09-14 MED ORDER — OMEGA-3-ACID ETHYL ESTERS 1 G PO CAPS: 2.0000 g | ORAL_CAPSULE | Freq: Two times a day (BID) | ORAL | 3 refills | Status: DC

## 2019-09-14 MED ORDER — AMLODIPINE BESYLATE 10 MG PO TABS: 5.0000 mg | ORAL_TABLET | Freq: Every day | ORAL | 2 refills | Status: DC

## 2019-09-14 NOTE — Progress Notes (Signed)
Cardiology Office Note:    Date:  09/14/2019   ID:  Ethan Chapman, DOB May 24, 1965, MRN [id number]  PCP:  Lise Auer, MD  Cardiologist:  Garwin Brothers, MD   Referring MD: Lise Auer, MD    ASSESSMENT:    1. Atherosclerosis of native coronary artery of native heart without angina pectoris   2. Mixed hypercholesterolemia and hypertriglyceridemia   3. Abnormal nuclear stress test    PLAN:    In order of problems listed above:  1. Secondary prevention stressed with patient.  Importance of compliance with diet medication stressed and he vocalized understanding.  He has good effort tolerance.  He is awaiting his CT FFR scan. 2. Essential hypertension: Blood pressure stable but because of erectile dysfunction I will stop metoprolol per his request.  He will take amlodipine 10 mg half tablet daily and keep a track of his blood pressures and send them to [place] in 1 week 3. Mixed dyslipidemia: Diet was emphasized and I.  Let him take his his previous fish oil medication is.  We will cancel Vascepa prescription.  He is doing well with diet and we will recheck his lipids in a month 4. Patient will be seen in follow-up appointment in 6 months or earlier if the patient has any concerns    Medication Adjustments/Labs and Tests Ordered: Current medicines are reviewed at length with the patient today.  Concerns regarding medicines are outlined above.  No orders of the defined types were placed in this encounter.  No orders of the defined types were placed in this encounter.    No chief complaint on file.    History of Present Illness:    Ethan Chapman is a 54 y.o. male.  Patient has past medical history of coronary atherosclerosis and hypertriglyceridemia.  He denies any problems at this time and takes care of records of daily living.  No chest pain orthopnea or PND.  He tells me that metoprolol causes it to have erectile dysfunction he also mentions to me that his insurance company  denied Vascepa.  No chest pain orthopnea or PND.  He walks about 2 miles a day without any problem.  At the time of my evaluation, the patient is alert awake oriented and in no distress.  Past Medical History:  Diagnosis Date  . Coronary atherosclerosis 06/18/2019  . Mixed hypercholesterolemia and hypertriglyceridemia   . Recurrent UTI     Past Surgical History:  Procedure Laterality Date  . HYPOSPADIAS CORRECTION    . LUMBAR DISC SURGERY  2005   L4-5 left sded microdiscectomy    Current Medications: Current Meds  Medication Sig  . Cinnamon 500 MG capsule Take 500 mg by mouth daily.  . cyclobenzaprine (FLEXERIL) 5 MG tablet Take 5 mg by mouth 3 (three) times daily as needed for muscle spasms.  . metoprolol succinate (TOPROL-XL) 50 MG 24 hr tablet Take 1 tablet (50 mg total) by mouth daily. Take with or immediately following a meal.  . nitrofurantoin, macrocrystal-monohydrate, (MACROBID) 100 MG capsule Take 100 mg by mouth daily.  . nitroGLYCERIN (NITROSTAT) 0.4 MG SL tablet   . omega-3 acid ethyl esters (LOVAZA) 1 g capsule Take 1 g by mouth 2 (two) times daily.  Marland Kitchen omeprazole (PRILOSEC) 40 MG capsule Take 40 mg by mouth daily.  . rosuvastatin (CRESTOR) 10 MG tablet Take 1 tablet (10 mg total) by mouth daily.  Marland Kitchen testosterone enanthate (DELATESTRYL) 200 MG/ML injection Inject into the muscle.  Marland Kitchen  traMADol (ULTRAM) 50 MG tablet Take 50 mg by mouth 3 (three) times daily as needed.     Allergies:   Naproxen   Social History   Socioeconomic History  . Marital status: Single    Spouse name: Not on file  . Number of children: Not on file  . Years of education: Not on file  . Highest education level: Not on file  Occupational History  . Not on file  Tobacco Use  . Smoking status: Never Smoker  . Smokeless tobacco: Former Network engineer and Sexual Activity  . Alcohol use: Yes    Comment: Rare, special occasions  . Drug use: Never  . Sexual activity: Not on file  Other Topics  Concern  . Not on file  Social History Narrative  . Not on file   Social Determinants of Health   Financial Resource Strain:   . Difficulty of Paying Living Expenses:   Food Insecurity:   . Worried About Charity fundraiser in the Last Year:   . Arboriculturist in the Last Year:   Transportation Needs:   . Film/video editor (Medical):   Marland Kitchen Lack of Transportation (Non-Medical):   Physical Activity:   . Days of Exercise per Week:   . Minutes of Exercise per Session:   Stress:   . Feeling of Stress :   Social Connections:   . Frequency of Communication with Friends and Family:   . Frequency of Social Gatherings with Friends and Family:   . Attends Religious Services:   . Active Member of Clubs or Organizations:   . Attends Archivist Meetings:   Marland Kitchen Marital Status:      Family History: The patient's family history includes Dementia in his mother; Hypertension in his father.  ROS:   Please see the history of present illness.    All other systems reviewed and are negative.  EKGs/Labs/Other Studies Reviewed:    The following studies were reviewed today: I reviewed lipids with him at length.   Recent Labs: 09/08/2019: ALT 50; BUN 12; Creatinine, Ser 0.93; Potassium 4.7; Sodium 135  Recent Lipid Panel    Component Value Date/Time   CHOL 154 09/08/2019 0823   TRIG 263 (H) 09/08/2019 0823   HDL 36 (L) 09/08/2019 0823   CHOLHDL 4.3 09/08/2019 0823   LDLCALC 75 09/08/2019 0823    Physical Exam:    VS:  BP 130/80   Pulse 68   Ht 6\' 1"  (1.854 m)   Wt 252 lb (114.3 kg)   SpO2 97%   BMI 33.25 kg/m     Wt Readings from Last 3 Encounters:  09/14/19 252 lb (114.3 kg)  08/24/19 255 lb (115.7 kg)  08/18/19 254 lb (115.2 kg)     GEN: Patient is in no acute distress HEENT: Normal NECK: No JVD; No carotid bruits LYMPHATICS: No lymphadenopathy CARDIAC: Hear sounds regular, 2/6 systolic murmur at the apex. RESPIRATORY:  Clear to auscultation without rales,  wheezing or rhonchi  ABDOMEN: Soft, non-tender, non-distended MUSCULOSKELETAL:  No edema; No deformity  SKIN: Warm and dry NEUROLOGIC:  Alert and oriented x 3 PSYCHIATRIC:  Normal affect   Signed, Jenean Lindau, MD  09/14/2019 2:25 PM    [place]

## 2019-09-14 NOTE — Patient Instructions (Signed)
Medication Instructions:  Your physician has recommended you make the following change in your medication:   Stop Vascepa and Metoprolol Start Amlodipine 10 mg take 1/2 tablet daily. Start Lovaza 2 gm twice daily.   *If you need a refill on your cardiac medications before your next appointment, please call your pharmacy*   Lab Work: None ordered If you have labs (blood work) drawn today and your tests are completely normal, you will receive your results only by: Marland Kitchen MyChart Message (if you have MyChart) OR . A paper copy in the mail If you have any lab test that is abnormal or we need to change your treatment, we will call you to review the results.   Testing/Procedures: None ordered   Follow-Up: At [facility], you and your health needs are our priority.  As part of our continuing mission to provide you with exceptional heart care, we have created designated Provider Care Teams.  These Care Teams include your primary Cardiologist (physician) and Advanced Practice Providers (APPs -  Physician Assistants and Nurse Practitioners) who all work together to provide you with the care you need, when you need it.  We recommend signing up for the patient portal called "MyChart".  Sign up information is provided on this After Visit Summary.  MyChart is used to connect with patients for Virtual Visits (Telemedicine).  Patients are able to view lab/test results, encounter notes, upcoming appointments, etc.  Non-urgent messages can be sent to your provider as well.   To learn more about what you can do with MyChart, go to [email or web address].    Your next appointment:   3 month(s)  The format for your next appointment:   In Person  Provider:   Belva Crome, MD   Other Instructions NA

## 2019-09-14 NOTE — Addendum Note (Signed)
Addended by: Eleonore Chiquito on: 09/14/2019 02:36 PM   Modules accepted: Orders

## 2019-09-22 ENCOUNTER — Telehealth (HOSPITAL_COMMUNITY): Payer: Self-pay | Admitting: *Deleted

## 2019-09-22 NOTE — Telephone Encounter (Signed)
Attempted to call patient regarding upcoming cardiac CT appointment. Left message on voicemail with name and callback number  [place] RN Navigator Cardiac [facility] [phone] Office [phone] Cell

## 2019-09-23 ENCOUNTER — Ambulatory Visit (HOSPITAL_COMMUNITY)
Admission: RE | Admit: 2019-09-23 | Discharge: 2019-09-23 | Disposition: A | Discharge status: Home / Self Care | Payer: 59 | Source: Ambulatory Visit | Attending: Cardiology | Admitting: Cardiology

## 2019-09-23 ENCOUNTER — Encounter (HOSPITAL_COMMUNITY): Payer: Self-pay

## 2019-09-23 ENCOUNTER — Telehealth: Payer: Self-pay

## 2019-09-23 MED ORDER — METOPROLOL TARTRATE 100 MG PO TABS: 100.0000 mg | ORAL_TABLET | Freq: Once | ORAL | 0 refills | Status: DC

## 2019-09-23 NOTE — Telephone Encounter (Signed)
Spoke to the patient just now and let him know that he would need to take his metoprolol tartrate 100 mg two hours prior to his scheduled CT. He verbalizes understanding and does not have any other issues or concerns at this time.    Encouraged patient to call back with any questions or concerns.

## 2019-09-23 NOTE — Progress Notes (Signed)
Spoke with Dr. Rennis Golden to reschedule patient due to patient taking a Cialis on 09/22/19.  CT Navigator Merle RN made aware.

## 2019-09-24 ENCOUNTER — Telehealth (HOSPITAL_COMMUNITY): Payer: Self-pay | Admitting: *Deleted

## 2019-09-24 NOTE — Telephone Encounter (Signed)
Returning pt's call regarding upcoming cardiac imaging study; pt verbalizes understanding of appt date/time, parking situation and where to check in, pre-test NPO status and medications ordered, and verified current allergies; name and call back number provided for further questions should they arise  Haidan Nhan Tai RN Navigator Cardiac Imaging [facility] [phone] office [phone] cell 

## 2019-09-27 ENCOUNTER — Other Ambulatory Visit: Payer: Self-pay

## 2019-09-27 ENCOUNTER — Ambulatory Visit (HOSPITAL_COMMUNITY)
Admission: RE | Admit: 2019-09-27 | Discharge: 2019-09-27 | Disposition: A | Discharge status: Home / Self Care | Payer: 59 | Source: Ambulatory Visit | Attending: Cardiology | Admitting: Cardiology

## 2019-09-27 DIAGNOSIS — I2510 Atherosclerotic heart disease of native coronary artery without angina pectoris: Secondary | ICD-10-CM

## 2019-09-27 DIAGNOSIS — R9439 Abnormal result of other cardiovascular function study: Secondary | ICD-10-CM | POA: Diagnosis present

## 2019-09-27 MED ORDER — IOHEXOL 350 MG/ML SOLN
80.0000 mL | Freq: Once | INTRAVENOUS | Status: AC | PRN
  Administered 2019-09-27: 80 mL via INTRAVENOUS

## 2019-09-27 MED ORDER — NITROGLYCERIN 0.4 MG SL SUBL
0.8000 mg | SUBLINGUAL_TABLET | Freq: Once | SUBLINGUAL | Status: AC
  Administered 2019-09-27: 0.8 mg via SUBLINGUAL

## 2019-09-27 MED ORDER — NITROGLYCERIN 0.4 MG SL SUBL
SUBLINGUAL_TABLET | SUBLINGUAL | Status: AC
  Filled 2019-09-27: qty 2

## 2019-09-27 NOTE — Progress Notes (Signed)
CT scan completed. Tolerated well. D/C home ambulatory with wife, awake and alert. In no distress 

## 2019-10-02 DIAGNOSIS — I2510 Atherosclerotic heart disease of native coronary artery without angina pectoris: Secondary | ICD-10-CM | POA: Diagnosis not present

## 2019-10-04 MED ORDER — NITROGLYCERIN 0.4 MG SL SUBL: 0.4000 mg | SUBLINGUAL_TABLET | SUBLINGUAL | 6 refills | Status: DC | PRN

## 2019-10-04 NOTE — Addendum Note (Signed)
Addended by: Eleonore Chiquito on: 10/04/2019 11:16 AM   Modules accepted: Orders

## 2019-10-05 ENCOUNTER — Other Ambulatory Visit: Payer: Self-pay

## 2019-10-05 ENCOUNTER — Encounter: Payer: Self-pay | Admitting: Cardiology

## 2019-10-05 ENCOUNTER — Ambulatory Visit (INDEPENDENT_AMBULATORY_CARE_PROVIDER_SITE_OTHER): Payer: 59 | Admitting: Cardiology

## 2019-10-05 VITALS — BP 120/70 | HR 74 | Resp 18 | Ht 73.0 in | Wt 250.6 lb

## 2019-10-05 DIAGNOSIS — I2510 Atherosclerotic heart disease of native coronary artery without angina pectoris: Secondary | ICD-10-CM | POA: Diagnosis not present

## 2019-10-05 DIAGNOSIS — R9439 Abnormal result of other cardiovascular function study: Secondary | ICD-10-CM

## 2019-10-05 DIAGNOSIS — E782 Mixed hyperlipidemia: Secondary | ICD-10-CM | POA: Diagnosis not present

## 2019-10-05 NOTE — Patient Instructions (Signed)
Medication Instructions:  No medication changes. *If you need a refill on your cardiac medications before your next appointment, please call your pharmacy*   Lab Work: Your physician recommends that you return for lab work in: 4 months (02/04/20). You need to have labs done when you are fasting.  You can come Monday through Friday 8:30 am to 12:00 pm and 1:15 to 4:30. You do not need to make an appointment as the order has already been placed. The labs you are going to have done are BMET, LFT and Lipids.   If you have labs (blood work) drawn today and your tests are completely normal, you will receive your results only by: Marland Kitchen MyChart Message (if you have MyChart) OR . A paper copy in the mail If you have any lab test that is abnormal or we need to change your treatment, we will call you to review the results.   Testing/Procedures: None ordered   Follow-Up: At [facility], you and your health needs are our priority.  As part of our continuing mission to provide you with exceptional heart care, we have created designated Provider Care Teams.  These Care Teams include your primary Cardiologist (physician) and Advanced Practice Providers (APPs -  Physician Assistants and Nurse Practitioners) who all work together to provide you with the care you need, when you need it.  We recommend signing up for the patient portal called "MyChart".  Sign up information is provided on this After Visit Summary.  MyChart is used to connect with patients for Virtual Visits (Telemedicine).  Patients are able to view lab/test results, encounter notes, upcoming appointments, etc.  Non-urgent messages can be sent to your provider as well.   To learn more about what you can do with MyChart, go to [email or web address].    Your next appointment:   6 month(s)  The format for your next appointment:   In Person  Provider:   Belva Crome, MD   Other Instructions NA

## 2019-10-05 NOTE — Progress Notes (Signed)
Cardiology Office Note:    Date:  10/05/2019   ID:  Ethan Chapman, DOB 13-Aug-1965, MRN [id number]  PCP:  Lise Auer, MD  Cardiologist:  Garwin Brothers, MD   Referring MD: Lise Auer, MD    ASSESSMENT:    1. Atherosclerosis of native coronary artery of native heart without angina pectoris   2. Mixed hypercholesterolemia and hypertriglyceridemia   3. Abnormal nuclear stress test    PLAN:    In order of problems listed above:  1. Coronary artery disease: Secondary prevention stressed with the patient.  Importance of compliance with diet medication stressed and vocalized understanding.  He is overweight.  Weight reduction was stressed.  Diet was emphasized and he promises to comply. 2. Abnormal nuclear stress test: Medical management was recommended.  Patient is tolerating it well and has excellent effort tolerance.  Sublingual nitroglycerin prescription was sent, its protocol and 911 protocol explained and the patient vocalized understanding questions were answered to the patient's satisfaction prescription was sent its use was emphasized.  I told the patient not to use it with Cialis and interactions were explained any vocalized understanding. 3. Lipidemia with hypertriglyceridemia: Patient is doing well with diet he promises to intensify his diet further he will be back in 4 months for fasting blood work.  Diet was emphasized extensively. 4. Patient will be seen in follow-up appointment in 6 months or earlier if the patient has any concerns    Medication Adjustments/Labs and Tests Ordered: Current medicines are reviewed at length with the patient today.  Concerns regarding medicines are outlined above.  No orders of the defined types were placed in this encounter.  No orders of the defined types were placed in this encounter.    Chief Complaint  Patient presents with  . Follow-up    FU for CT FFR results. No CP.     History of Present Illness:    Ethan Chapman  is a 54 y.o. male.  Patient has past medical history of coronary artery disease.  CT scanning and FFR measurements are mentioned below.  Medical therapy was recommended.  Patient is doing fine.  He can walk half an hour without any problems.  No chest pain orthopnea or PND.  At the time of my evaluation, the patient is alert awake oriented and in no distress.  Past Medical History:  Diagnosis Date  . Coronary atherosclerosis 06/18/2019  . Mixed hypercholesterolemia and hypertriglyceridemia   . Recurrent UTI     Past Surgical History:  Procedure Laterality Date  . HYPOSPADIAS CORRECTION    . LUMBAR DISC SURGERY  2005   L4-5 left sded microdiscectomy    Current Medications: Current Meds  Medication Sig  . amLODipine (NORVASC) 10 MG tablet Take 0.5 tablets (5 mg total) by mouth daily.  . Cinnamon 500 MG capsule Take 500 mg by mouth daily.  . cyclobenzaprine (FLEXERIL) 5 MG tablet Take 5 mg by mouth 3 (three) times daily as needed for muscle spasms.  . nitrofurantoin, macrocrystal-monohydrate, (MACROBID) 100 MG capsule Take 100 mg by mouth daily.  . nitroGLYCERIN (NITROSTAT) 0.4 MG SL tablet Place 1 tablet (0.4 mg total) under the tongue every 5 (five) minutes as needed for chest pain.  Marland Kitchen omega-3 acid ethyl esters (LOVAZA) 1 g capsule Take 2 capsules (2 g total) by mouth 2 (two) times daily.  Marland Kitchen omeprazole (PRILOSEC) 40 MG capsule Take 40 mg by mouth daily.  . tadalafil (CIALIS) 5 MG tablet Take 5 mg  by mouth as needed.  . testosterone enanthate (DELATESTRYL) 200 MG/ML injection Inject into the muscle.  . traMADol (ULTRAM) 50 MG tablet Take 50 mg by mouth 3 (three) times daily as needed.     Allergies:   Naproxen   Social History   Socioeconomic History  . Marital status: Single    Spouse name: Not on file  . Number of children: Not on file  . Years of education: Not on file  . Highest education level: Not on file  Occupational History  . Not on file  Tobacco Use  . Smoking  status: Never Smoker  . Smokeless tobacco: Former Network engineer  . Vaping Use: Never used  Substance and Sexual Activity  . Alcohol use: Yes    Comment: Rare, special occasions  . Drug use: Never  . Sexual activity: Not on file  Other Topics Concern  . Not on file  Social History Narrative  . Not on file   Social Determinants of Health   Financial Resource Strain:   . Difficulty of Paying Living Expenses:   Food Insecurity:   . Worried About Charity fundraiser in the Last Year:   . Arboriculturist in the Last Year:   Transportation Needs:   . Film/video editor (Medical):   Marland Kitchen Lack of Transportation (Non-Medical):   Physical Activity:   . Days of Exercise per Week:   . Minutes of Exercise per Session:   Stress:   . Feeling of Stress :   Social Connections:   . Frequency of Communication with Friends and Family:   . Frequency of Social Gatherings with Friends and Family:   . Attends Religious Services:   . Active Member of Clubs or Organizations:   . Attends Archivist Meetings:   Marland Kitchen Marital Status:      Family History: The patient's family history includes Dementia in his mother; Hypertension in his father.  ROS:   Please see the history of present illness.    All other systems reviewed and are negative.  EKGs/Labs/Other Studies Reviewed:    The following studies were reviewed today: Study Highlights   Nuclear stress EF: 45%.  The left ventricular ejection fraction is mildly decreased (45-54%).  There was no ST segment deviation noted during stress.  Defect 1: There is a small defect of mild severity present in the basal inferior and mid inferior location.  Findings consistent with ischemia.  This is an intermediate risk study.  Mild ischemia involving basal and mid portion of the inferior wall.  Mildly reduced EF   IMPRESSIONS    1. Left ventricular ejection fraction, by estimation, is 55 to 60%. The  left ventricle has normal  function. The left ventricle has no regional  wall motion abnormalities. There is mild concentric left ventricular  hypertrophy. Left ventricular diastolic  parameters were normal.  2. Right ventricular systolic function is normal. The right ventricular  size is normal. There is normal pulmonary artery systolic pressure.  3. The mitral valve is normal in structure. Trivial mitral valve  regurgitation. No evidence of mitral stenosis.  4. The aortic valve is normal in structure. Aortic valve regurgitation is  not visualized. No aortic stenosis is present.  5. The inferior vena cava is normal in size with greater than 50%  respiratory variability, suggesting right atrial pressure of 3 mmHg.    IMPRESSION: 1. Distal RCA has a FFR value of 0.78, which falls in the grey zone (  indeterminate). The vessel is small caliber at this location. This could represent focal significant stenosis vs. small vessel disease.  2.  Otherwise, no significant stenosis is noted.  RECOMMENDATIONS: Goal directed medical therapy and aggressive risk factor modification for secondary prevention of coronary artery disease.   Electronically Signed   By: Weston Brass   On: 10/02/2019 10:17   Recent Labs: 09/08/2019: ALT 50; BUN 12; Creatinine, Ser 0.93; Potassium 4.7; Sodium 135  Recent Lipid Panel    Component Value Date/Time   CHOL 154 09/08/2019 0823   TRIG 263 (H) 09/08/2019 0823   HDL 36 (L) 09/08/2019 0823   CHOLHDL 4.3 09/08/2019 0823   LDLCALC 75 09/08/2019 0823    Physical Exam:    VS:  BP 120/70 (BP Location: Left Arm, Patient Position: Sitting, Cuff Size: Normal)   Pulse 74   Resp 18   Wt 250 lb 9.6 oz (113.7 kg)   SpO2 98%   BMI 33.06 kg/m     Wt Readings from Last 3 Encounters:  10/05/19 250 lb 9.6 oz (113.7 kg)  09/14/19 252 lb (114.3 kg)  08/24/19 255 lb (115.7 kg)     GEN: Patient is in no acute distress HEENT: Normal NECK: No JVD; No carotid bruits LYMPHATICS: No  lymphadenopathy CARDIAC: Hear sounds regular, 2/6 systolic murmur at the apex. RESPIRATORY:  Clear to auscultation without rales, wheezing or rhonchi  ABDOMEN: Soft, non-tender, non-distended MUSCULOSKELETAL:  No edema; No deformity  SKIN: Warm and dry NEUROLOGIC:  Alert and oriented x 3 PSYCHIATRIC:  Normal affect   Signed, Garwin Brothers, MD  10/05/2019 8:31 AM    [facility]

## 2019-10-27 ENCOUNTER — Other Ambulatory Visit: Payer: Self-pay | Admitting: Cardiology

## 2019-11-29 ENCOUNTER — Ambulatory Visit: Payer: Self-pay | Admitting: Podiatry

## 2019-12-06 ENCOUNTER — Other Ambulatory Visit: Payer: Self-pay

## 2019-12-06 ENCOUNTER — Ambulatory Visit (INDEPENDENT_AMBULATORY_CARE_PROVIDER_SITE_OTHER): Payer: 59

## 2019-12-06 ENCOUNTER — Other Ambulatory Visit: Payer: Self-pay | Admitting: Podiatry

## 2019-12-06 ENCOUNTER — Ambulatory Visit (INDEPENDENT_AMBULATORY_CARE_PROVIDER_SITE_OTHER): Payer: 59 | Admitting: Podiatry

## 2019-12-06 DIAGNOSIS — M7671 Peroneal tendinitis, right leg: Secondary | ICD-10-CM

## 2019-12-06 DIAGNOSIS — M25471 Effusion, right ankle: Secondary | ICD-10-CM

## 2019-12-06 DIAGNOSIS — M79671 Pain in right foot: Secondary | ICD-10-CM

## 2019-12-06 DIAGNOSIS — M722 Plantar fascial fibromatosis: Secondary | ICD-10-CM

## 2019-12-06 DIAGNOSIS — M25571 Pain in right ankle and joints of right foot: Secondary | ICD-10-CM

## 2019-12-07 ENCOUNTER — Other Ambulatory Visit: Payer: Self-pay | Admitting: Cardiology

## 2019-12-13 ENCOUNTER — Other Ambulatory Visit: Payer: Self-pay | Admitting: Podiatry

## 2019-12-13 DIAGNOSIS — M722 Plantar fascial fibromatosis: Secondary | ICD-10-CM

## 2019-12-16 ENCOUNTER — Ambulatory Visit: Payer: 59 | Admitting: Cardiology

## 2019-12-16 NOTE — Progress Notes (Signed)
  Subjective:  Patient ID: Ethan Chapman, male    DOB: 1965/04/30,  MRN: [id number]  Chief Complaint  Patient presents with  . Foot Pain    occasional pain at Rt lateral foot x off and on 8 mo; no injury -worse barefooted and with boots; 3-4-8/10 sharp ains Tx: excedrin, and horse oinmtne     54 y.o. male presents with the above complaint. History confirmed with patient.   Objective:  Physical Exam: warm, good capillary refill, no trophic changes or ulcerative lesions, normal DP and PT pulses and normal sensory exam. Right Foot: POP 5th met base, mild pain along the peroneal    No images are attached to the encounter.  Radiographs: X-ray of the right foot: no fracture, dislocation, swelling or degenerative changes noted Assessment:   1. Peroneal tendonitis of right lower extremity   2. Pain and swelling of right ankle      Plan:  Patient was evaluated and treated and all questions answered.  Peroneal tendonitis Right -XR taken and reviewed with patient -ASO Brace dispensed  No follow-ups on file.

## 2019-12-17 ENCOUNTER — Ambulatory Visit: Payer: 59 | Admitting: Cardiology

## 2020-04-11 ENCOUNTER — Ambulatory Visit: Payer: 59 | Admitting: Cardiology

## 2020-04-28 ENCOUNTER — Ambulatory Visit: Payer: Self-pay | Admitting: Cardiology

## 2020-05-03 ENCOUNTER — Ambulatory Visit: Payer: Self-pay | Admitting: Cardiology

## 2020-06-01 ENCOUNTER — Ambulatory Visit (INDEPENDENT_AMBULATORY_CARE_PROVIDER_SITE_OTHER): Payer: Managed Care, Other (non HMO) | Admitting: Cardiology

## 2020-06-01 ENCOUNTER — Encounter: Payer: Self-pay | Admitting: Cardiology

## 2020-06-01 ENCOUNTER — Other Ambulatory Visit: Payer: Self-pay

## 2020-06-01 VITALS — BP 136/76 | HR 80 | Ht 73.0 in | Wt 260.8 lb

## 2020-06-01 DIAGNOSIS — E66811 Obesity, class 1: Secondary | ICD-10-CM

## 2020-06-01 DIAGNOSIS — I10 Essential (primary) hypertension: Secondary | ICD-10-CM

## 2020-06-01 DIAGNOSIS — E782 Mixed hyperlipidemia: Secondary | ICD-10-CM

## 2020-06-01 DIAGNOSIS — I2510 Atherosclerotic heart disease of native coronary artery without angina pectoris: Secondary | ICD-10-CM | POA: Diagnosis not present

## 2020-06-01 DIAGNOSIS — E669 Obesity, unspecified: Secondary | ICD-10-CM

## 2020-06-01 HISTORY — DX: Obesity, unspecified: E66.9

## 2020-06-01 HISTORY — DX: Obesity, class 1: E66.811

## 2020-06-01 HISTORY — DX: Essential (primary) hypertension: I10

## 2020-06-01 NOTE — Patient Instructions (Signed)

## 2020-06-01 NOTE — Progress Notes (Signed)
Cardiology Office Note:    Date:  06/01/2020   ID:  Ethan Chapman, DOB 19-May-1965, MRN [id number]  PCP:  Lise Auer, MD  Cardiologist:  Garwin Brothers, MD   Referring MD: Lise Auer, MD    ASSESSMENT:    1. Atherosclerosis of native coronary artery of native heart without angina pectoris   2. Mixed hypercholesterolemia and hypertriglyceridemia   3. Obesity (BMI 30.0-34.9)   4. Essential hypertension    PLAN:    In order of problems listed above:  1. Coronary artery disease: Secondary prevention stressed with patient.  Importance of compliance with diet medication stressed any vocalized understanding.  I told him to walk at least half an hour a day on a regular basis and he is willing to do so. 2. Essential hypertension: Blood pressure stable and diet was emphasized.  Lifestyle modification and salt intake issues were discussed. 3. Mixed dyslipidemia and hypertriglyceridemia: Patient is on guideline directed medical therapy.  He will be back in the next few days for complete blood work.  Diet was emphasized 4. Obesity: Patient has significant obesity issues and leads a sedentary lifestyle.  I mentioned to him the fact that this concerns me alert in view of his coronary artery disease and overall.   Medication Adjustments/Labs and Tests Ordered: Current medicines are reviewed at length with the patient today.  Concerns regarding medicines are outlined above.  No orders of the defined types were placed in this encounter.  No orders of the defined types were placed in this encounter.    No chief complaint on file.    History of Present Illness:    Ethan Chapman is a 55 y.o. male.  Patient has past medical history of coronary artery disease, essential hypertension, dyslipidemia and obesity.  He mentions to me that he had COVID-19 infection and subsequently got better.  No chest pain orthopnea or PND.  At the time of my evaluation, the patient is alert awake oriented  and in no distress.  He leads a sedentary lifestyle and has not exercised on a regular basis.  Past Medical History:  Diagnosis Date  . Abnormal nuclear stress test 08/24/2019  . Coronary atherosclerosis 06/18/2019  . Mixed hypercholesterolemia and hypertriglyceridemia   . Recurrent UTI     Past Surgical History:  Procedure Laterality Date  . HYPOSPADIAS CORRECTION    . LUMBAR DISC SURGERY  2005   L4-5 left sded microdiscectomy    Current Medications: Current Meds  Medication Sig  . amLODipine (NORVASC) 10 MG tablet TAKE ONE-HALF TABLET DAILY  . Cinnamon 500 MG capsule Take 500 mg by mouth daily.  Marland Kitchen omega-3 acid ethyl esters (LOVAZA) 1 g capsule Take 2 capsules (2 g total) by mouth 2 (two) times daily.  Marland Kitchen omeprazole (PRILOSEC) 40 MG capsule Take 40 mg by mouth daily.  . rosuvastatin (CRESTOR) 10 MG tablet TAKE ONE (1) TABLET ONCE DAILY  . tadalafil (CIALIS) 5 MG tablet Take 5 mg by mouth as needed for erectile dysfunction.  Marland Kitchen testosterone cypionate (DEPOTESTOSTERONE CYPIONATE) 200 MG/ML injection Inject 0.6 mLs into the muscle. Every 10 days     Allergies:   Naproxen   Social History   Socioeconomic History  . Marital status: Married    Spouse name: Not on file  . Number of children: Not on file  . Years of education: Not on file  . Highest education level: Not on file  Occupational History  . Not on file  Tobacco  Use  . Smoking status: Never Smoker  . Smokeless tobacco: Former Clinical biochemist  . Vaping Use: Never used  Substance and Sexual Activity  . Alcohol use: Yes    Comment: Rare, special occasions  . Drug use: Never  . Sexual activity: Not on file  Other Topics Concern  . Not on file  Social History Narrative  . Not on file   Social Determinants of Health   Financial Resource Strain: Not on file  Food Insecurity: Not on file  Transportation Needs: Not on file  Physical Activity: Not on file  Stress: Not on file  Social Connections: Not on file      Family History: The patient's family history includes Dementia in his mother; Hypertension in his father.  ROS:   Please see the history of present illness.    All other systems reviewed and are negative.  EKGs/Labs/Other Studies Reviewed:    The following studies were reviewed today: EXAM: CT FFR ANALYSIS  CLINICAL DATA:  CAD monitoring, prior imaging or treadmill < 33yrs  FINDINGS: FFRct analysis was performed on the original cardiac CT angiogram dataset. Diagrammatic representation of the FFRct analysis is provided in a separate PDF document in PACS. This dictation was created using the PDF document and an interactive 3D model of the results. 3D model is not available in the EMR/PACS. Normal FFR range is >0.80. Indeterminate (grey) zone is 0.76-0.80.  1. Left Main: FFR = 0.96  2. LAD: Proximal FFR = 0.94, Mid FFR = 0.88, Distal FFR = 0.85, not mapped distally 3. LCX: Proximal FFR = 0.97, Distal FFR = 0.91 4. RCA: Proximal FFR = 0.94, Mid FFR =0.89, Distal FFR = 0.78  IMPRESSION: 1. Distal RCA has a FFR value of 0.78, which falls in the grey zone (indeterminate). The vessel is small caliber at this location. This could represent focal significant stenosis vs. small vessel disease.  2.  Otherwise, no significant stenosis is noted.  RECOMMENDATIONS: Goal directed medical therapy and aggressive risk factor modification for secondary prevention of coronary artery disease.   Electronically Signed   By: Weston Brass   On: 10/02/2019 10:17   Recent Labs: 09/08/2019: ALT 50; BUN 12; Creatinine, Ser 0.93; Potassium 4.7; Sodium 135  Recent Lipid Panel    Component Value Date/Time   CHOL 154 09/08/2019 0823   TRIG 263 (H) 09/08/2019 0823   HDL 36 (L) 09/08/2019 0823   CHOLHDL 4.3 09/08/2019 0823   LDLCALC 75 09/08/2019 0823    Physical Exam:    VS:  BP 136/76   Pulse 80   Ht 6\' 1"  (1.854 m)   Wt 260 lb 12.8 oz (118.3 kg)   SpO2 96%   BMI 34.41  kg/m     Wt Readings from Last 3 Encounters:  06/01/20 260 lb 12.8 oz (118.3 kg)  10/05/19 250 lb 9.6 oz (113.7 kg)  09/14/19 252 lb (114.3 kg)     GEN: Patient is in no acute distress HEENT: Normal NECK: No JVD; No carotid bruits LYMPHATICS: No lymphadenopathy CARDIAC: Hear sounds regular, 2/6 systolic murmur at the apex. RESPIRATORY:  Clear to auscultation without rales, wheezing or rhonchi  ABDOMEN: Soft, non-tender, non-distended MUSCULOSKELETAL:  No edema; No deformity  SKIN: Warm and dry NEUROLOGIC:  Alert and oriented x 3 PSYCHIATRIC:  Normal affect   Signed, 11/14/19, MD  06/01/2020 2:30 PM    [facility]

## 2020-06-06 LAB — HEPATIC FUNCTION PANEL
ALT: 69 IU/L — ABNORMAL HIGH (ref 0–44)
AST: 41 IU/L — ABNORMAL HIGH (ref 0–40)
Albumin: 4.3 g/dL (ref 3.8–4.9)
Alkaline Phosphatase: 63 IU/L (ref 44–121)
Bilirubin Total: 0.5 mg/dL (ref 0.0–1.2)
Bilirubin, Direct: 0.17 mg/dL (ref 0.00–0.40)
Total Protein: 6.3 g/dL (ref 6.0–8.5)

## 2020-06-06 LAB — CBC WITH DIFFERENTIAL/PLATELET
Basophils Absolute: 0 10*3/uL (ref 0.0–0.2)
Basos: 1 %
EOS (ABSOLUTE): 0.2 10*3/uL (ref 0.0–0.4)
Eos: 3 %
Hematocrit: 43 % (ref 37.5–51.0)
Hemoglobin: 14.5 g/dL (ref 13.0–17.7)
Immature Grans (Abs): 0 10*3/uL (ref 0.0–0.1)
Immature Granulocytes: 0 %
Lymphocytes Absolute: 2 10*3/uL (ref 0.7–3.1)
Lymphs: 29 %
MCH: 28.8 pg (ref 26.6–33.0)
MCHC: 33.7 g/dL (ref 31.5–35.7)
MCV: 85 fL (ref 79–97)
Monocytes Absolute: 0.5 10*3/uL (ref 0.1–0.9)
Monocytes: 7 %
Neutrophils Absolute: 4.2 10*3/uL (ref 1.4–7.0)
Neutrophils: 60 %
Platelets: 296 10*3/uL (ref 150–450)
RBC: 5.04 x10E6/uL (ref 4.14–5.80)
RDW: 13.6 % (ref 11.6–15.4)
WBC: 6.9 10*3/uL (ref 3.4–10.8)

## 2020-06-06 LAB — BASIC METABOLIC PANEL
BUN/Creatinine Ratio: 10 (ref 9–20)
BUN: 9 mg/dL (ref 6–24)
CO2: 22 mmol/L (ref 20–29)
Calcium: 8.7 mg/dL (ref 8.7–10.2)
Chloride: 102 mmol/L (ref 96–106)
Creatinine, Ser: 0.86 mg/dL (ref 0.76–1.27)
GFR calc Af Amer: 114 mL/min/{1.73_m2} (ref >59)
GFR calc non Af Amer: 98 mL/min/{1.73_m2} (ref >59)
Glucose: 133 mg/dL — ABNORMAL HIGH (ref 65–99)
Potassium: 4.5 mmol/L (ref 3.5–5.2)
Sodium: 135 mmol/L (ref 134–144)

## 2020-06-06 LAB — LIPID PANEL
Chol/HDL Ratio: 4.1 ratio (ref 0.0–5.0)
Cholesterol, Total: 143 mg/dL (ref 100–199)
HDL: 35 mg/dL — ABNORMAL LOW (ref >39)
LDL Chol Calc (NIH): 83 mg/dL (ref 0–99)
Triglycerides: 139 mg/dL (ref 0–149)
VLDL Cholesterol Cal: 25 mg/dL (ref 5–40)

## 2020-06-06 LAB — TSH: TSH: 0.94 u[IU]/mL (ref 0.450–4.500)

## 2020-08-01 ENCOUNTER — Telehealth: Payer: Self-pay | Admitting: Cardiology

## 2020-08-01 NOTE — Telephone Encounter (Signed)
Spoke to the patient just now and let him know that he needs to follow his PCP's recommendations as Dr. Tomie China is out of the office for several weeks. He verbalizes understanding.

## 2020-08-01 NOTE — Telephone Encounter (Signed)
Pt c/o medication issue:  1. Name of Medication: amLODipine (NORVASC) 10 MG tablet Metoprolol (thinks it is 45 MG, didn't have prescription with him at time of call)  2. How are you currently taking this medication (dosage and times per day)? 1 tablet by mouth daily of amlodipine, 1 tablet daily of metoprolol   3. Are you having a reaction (difficulty breathing--STAT)? No   4. What is your medication issue? Ethan Chapman is calling stating his PCP Dr. Welton Flakes has increased his BP medication amLODipine and started him on Metoprolol due to his BP being 165/103. He states he is taking 10 MG's of amLODipine daily and what he believes is 45 MG's of Metoprolol daily as of today. He wanted to confirm Dr. Tomie China agrees with these medication changes. Please advise.

## 2020-08-03 ENCOUNTER — Encounter: Payer: Self-pay | Admitting: Cardiology

## 2020-08-03 ENCOUNTER — Other Ambulatory Visit: Payer: Self-pay

## 2020-08-03 ENCOUNTER — Ambulatory Visit (INDEPENDENT_AMBULATORY_CARE_PROVIDER_SITE_OTHER): Payer: Managed Care, Other (non HMO) | Admitting: Cardiology

## 2020-08-03 VITALS — BP 150/100 | HR 79 | Ht 72.0 in | Wt 253.0 lb

## 2020-08-03 DIAGNOSIS — I2510 Atherosclerotic heart disease of native coronary artery without angina pectoris: Secondary | ICD-10-CM | POA: Diagnosis not present

## 2020-08-03 DIAGNOSIS — K0889 Other specified disorders of teeth and supporting structures: Secondary | ICD-10-CM

## 2020-08-03 DIAGNOSIS — R519 Headache, unspecified: Secondary | ICD-10-CM

## 2020-08-03 DIAGNOSIS — I10 Essential (primary) hypertension: Secondary | ICD-10-CM

## 2020-08-03 DIAGNOSIS — G44201 Tension-type headache, unspecified, intractable: Secondary | ICD-10-CM | POA: Diagnosis not present

## 2020-08-03 HISTORY — DX: Headache, unspecified: R51.9

## 2020-08-03 LAB — BASIC METABOLIC PANEL
BUN/Creatinine Ratio: 13 (ref 9–20)
BUN: 11 mg/dL (ref 6–24)
CO2: 21 mmol/L (ref 20–29)
Calcium: 9.6 mg/dL (ref 8.7–10.2)
Chloride: 98 mmol/L (ref 96–106)
Creatinine, Ser: 0.87 mg/dL (ref 0.76–1.27)
Glucose: 147 mg/dL — ABNORMAL HIGH (ref 65–99)
Potassium: 4.6 mmol/L (ref 3.5–5.2)
Sodium: 136 mmol/L (ref 134–144)
eGFR: 103 mL/min/{1.73_m2} (ref >59)

## 2020-08-03 LAB — CBC WITH DIFFERENTIAL/PLATELET
Basophils Absolute: 0.1 10*3/uL (ref 0.0–0.2)
Basos: 1 %
EOS (ABSOLUTE): 0.2 10*3/uL (ref 0.0–0.4)
Eos: 3 %
Hematocrit: 49.6 % (ref 37.5–51.0)
Hemoglobin: 16.6 g/dL (ref 13.0–17.7)
Immature Grans (Abs): 0 10*3/uL (ref 0.0–0.1)
Immature Granulocytes: 1 %
Lymphocytes Absolute: 2.5 10*3/uL (ref 0.7–3.1)
Lymphs: 30 %
MCH: 28.6 pg (ref 26.6–33.0)
MCHC: 33.5 g/dL (ref 31.5–35.7)
MCV: 86 fL (ref 79–97)
Monocytes Absolute: 0.5 10*3/uL (ref 0.1–0.9)
Monocytes: 6 %
Neutrophils Absolute: 5.1 10*3/uL (ref 1.4–7.0)
Neutrophils: 59 %
Platelets: 346 10*3/uL (ref 150–450)
RBC: 5.8 x10E6/uL (ref 4.14–5.80)
RDW: 13.1 % (ref 11.6–15.4)
WBC: 8.4 10*3/uL (ref 3.4–10.8)

## 2020-08-03 NOTE — Progress Notes (Signed)
Cardiology Office Note:    Date:  08/03/2020   ID:  [name], DOB 1965/12/12, MRN [id number]  PCP:  Lise Auer, MD  Cardiologist:  Gypsy Balsam, MD    Referring MD: Lise Auer, MD   Chief Complaint  Patient presents with  . Hypertension    History of Present Illness:    Ethan Chapman is a 55 y.o. male with past medical history significant for essential hypertension, dyslipidemia, coronary artery disease.  Initially he was seen because CT of his chest showed some calcification of the coronary artery.  After that he did have coronary CT angio which showed possibility of distal disease.  He denies have any chest pain tightness squeezing pressure burning chest however what brought him to my office today is the fact that he has been having headache.  Few weeks ago he did have some tooth extraction done from the lower jaw he also had some bone grafting done there.  He was on a different antibiotic.  However within the last few days he started experiencing severe headache.  He never had a headache.  He also noted his blood pressure being up he is thinking that his headache is related to his high blood pressure.  He also just does not feel well when he does have headache he is not able to function.  Quite severe.  Unremarkable interview he does complain of having headache changing position of his body tapping his head does not make headache worse.  He did see oral surgeon for his tooth extraction and he was told everything looks good there.  Past Medical History:  Diagnosis Date  . Abnormal nuclear stress test 08/24/2019  . Coronary atherosclerosis 06/18/2019  . Mixed hypercholesterolemia and hypertriglyceridemia   . Recurrent UTI     Past Surgical History:  Procedure Laterality Date  . HYPOSPADIAS CORRECTION    . LUMBAR DISC SURGERY  2005   L4-5 left sded microdiscectomy    Current Medications: Current Meds  Medication Sig  . amLODipine (NORVASC) 10 MG tablet TAKE  ONE-HALF TABLET DAILY (Patient taking differently: Take 10 mg by mouth daily.)  . Cinnamon 500 MG capsule Take 500 mg by mouth daily.  Marland Kitchen doxycycline (VIBRAMYCIN) 100 MG capsule Take 100 mg by mouth 2 (two) times daily.  . metoprolol succinate (TOPROL-XL) 50 MG 24 hr tablet Take 50 mg by mouth daily.  Marland Kitchen omega-3 acid ethyl esters (LOVAZA) 1 g capsule Take 2 capsules (2 g total) by mouth 2 (two) times daily.  Marland Kitchen omeprazole (PRILOSEC) 40 MG capsule Take 40 mg by mouth daily.  . penicillin v potassium (VEETID) 500 MG tablet Take 500 mg by mouth 4 (four) times daily.  . Rimegepant Sulfate (NURTEC) 75 MG TBDP Take 1 tablet by mouth as needed (headaches).  . rosuvastatin (CRESTOR) 10 MG tablet TAKE ONE (1) TABLET ONCE DAILY (Patient taking differently: Take 10 mg by mouth daily.)  . testosterone cypionate (DEPOTESTOSTERONE CYPIONATE) 200 MG/ML injection Inject 0.6 mLs into the muscle See admin instructions. Every 10 days     Allergies:   Naproxen   Social History   Socioeconomic History  . Marital status: Married    Spouse name: Not on file  . Number of children: Not on file  . Years of education: Not on file  . Highest education level: Not on file  Occupational History  . Not on file  Tobacco Use  . Smoking status: Never Smoker  . Smokeless tobacco: Former Neurosurgeon  Vaping Use  . Vaping Use: Never used  Substance and Sexual Activity  . Alcohol use: Yes    Comment: Rare, special occasions  . Drug use: Never  . Sexual activity: Not on file  Other Topics Concern  . Not on file  Social History Narrative  . Not on file   Social Determinants of Health   Financial Resource Strain: Not on file  Food Insecurity: Not on file  Transportation Needs: Not on file  Physical Activity: Not on file  Stress: Not on file  Social Connections: Not on file     Family History: The patient's family history includes Dementia in his mother; Hypertension in his father. ROS:   Please see the history of  present illness.    All 14 point review of systems negative except as described per history of present illness  EKGs/Labs/Other Studies Reviewed:      Recent Labs: 06/06/2020: ALT 69; BUN 9; Creatinine, Ser 0.86; Hemoglobin 14.5; Platelets 296; Potassium 4.5; Sodium 135; TSH 0.940  Recent Lipid Panel    Component Value Date/Time   CHOL 143 06/06/2020 0853   TRIG 139 06/06/2020 0853   HDL 35 (L) 06/06/2020 0853   CHOLHDL 4.1 06/06/2020 0853   LDLCALC 83 06/06/2020 0853    Physical Exam:    VS:  BP (!) 150/100 (BP Location: Right Arm, Patient Position: Sitting)   Pulse 79   Ht 6' (1.829 m)   Wt 253 lb (114.8 kg)   SpO2 96%   BMI 34.31 kg/m     Wt Readings from Last 3 Encounters:  08/03/20 253 lb (114.8 kg)  06/01/20 260 lb 12.8 oz (118.3 kg)  10/05/19 250 lb 9.6 oz (113.7 kg)     GEN:  Well nourished, well developed in no acute distress HEENT: Normal NECK: No JVD; No carotid bruits LYMPHATICS: No lymphadenopathy CARDIAC: RRR, no murmurs, no rubs, no gallops RESPIRATORY:  Clear to auscultation without rales, wheezing or rhonchi  ABDOMEN: Soft, non-tender, non-distended MUSCULOSKELETAL:  No edema; No deformity  SKIN: Warm and dry LOWER EXTREMITIES: no swelling NEUROLOGIC:  Alert and oriented x 3 PSYCHIATRIC:  Normal affect   ASSESSMENT:    1. Essential hypertension   2. Toothache   3. Atherosclerosis of native coronary artery of native heart without angina pectoris   4. Acute intractable tension-type headache    PLAN:    In order of problems listed above:  1. Essential hypertension still not well controlled.  I will check Chem-7 today if Chem-7 is fine I will initiate losartan 25 twice daily and then Chem-7 will be repeated again.  Recently his amlodipine dose has been increased from 5 mg to 10 mg daily.  I suspect his high blood pressure is mostly right now related to headache that he suffered from of course it is possible that he had headache because of high  blood pressure but usually with this elevation of blood pressure people do not suffer from headache. 2. Tooth ache/headache.  I did talk to his primary care physician in my opinion he need to have a CT of his head and probably CT of his sinuses.  I will do Chem-7 today to check for feasibility for ARB however I will also check CBC plus differential to make sure he does not have any significant evidence of infection. 3. Coronary artery disease stable denies having issue the key is risk factors modifications. 4. Dyslipidemia, I do have his fasting lipid profile with LDL of 83 and HDL  35 this is from June 06, 2020.  He is already on high intense statin which I will continue.  However in the future we will continue discussion about augmenting his therapy for his cholesterol. I did consult his primary care physician Dr. Welton Flakes for this visit  Medication Adjustments/Labs and Tests Ordered: Current medicines are reviewed at length with the patient today.  Concerns regarding medicines are outlined above.  Orders Placed This Encounter  Procedures  . Basic metabolic panel  . CBC w/Diff   Medication changes: No orders of the defined types were placed in this encounter.   Signed, Georgeanna Lea, MD, [facility] 08/03/2020 10:25 AM     [facility]

## 2020-08-03 NOTE — Patient Instructions (Signed)
Medication Instructions:  Your physician recommends that you continue on your current medications as directed. Please refer to the Current Medication list given to you today.  *If you need a refill on your cardiac medications before your next appointment, please call your pharmacy*   Lab Work: Your physician recommends that you return for lab work today: bmp, CBC w/diff  If you have labs (blood work) drawn today and your tests are completely normal, you will receive your results only by: Marland Kitchen MyChart Message (if you have MyChart) OR . A paper copy in the mail If you have any lab test that is abnormal or we need to change your treatment, we will call you to review the results.   Testing/Procedures: None   Follow-Up: At [facility], you and your health needs are our priority.  As part of our continuing mission to provide you with exceptional heart care, we have created designated Provider Care Teams.  These Care Teams include your primary Cardiologist (physician) and Advanced Practice Providers (APPs -  Physician Assistants and Nurse Practitioners) who all work together to provide you with the care you need, when you need it.  We recommend signing up for the patient portal called "MyChart".  Sign up information is provided on this After Visit Summary.  MyChart is used to connect with patients for Virtual Visits (Telemedicine).  Patients are able to view lab/test results, encounter notes, upcoming appointments, etc.  Non-urgent messages can be sent to your provider as well.   To learn more about what you can do with MyChart, go to [email or web address].    Your next appointment:   1 month(s)  The format for your next appointment:   In Person  Provider:   Gypsy Balsam, MD   Other Instructions

## 2020-08-04 ENCOUNTER — Telehealth: Payer: Self-pay

## 2020-08-04 NOTE — Telephone Encounter (Signed)
Patient notified of test results 

## 2020-08-04 NOTE — Telephone Encounter (Signed)
-----   Message from Georgeanna Lea, MD sent at 08/04/2020 10:48 AM EDT ----- All labs are looking good including white blood cell count which are normal indicating no active infection.

## 2020-08-30 ENCOUNTER — Ambulatory Visit (INDEPENDENT_AMBULATORY_CARE_PROVIDER_SITE_OTHER): Payer: Managed Care, Other (non HMO) | Admitting: Cardiology

## 2020-08-30 ENCOUNTER — Encounter: Payer: Self-pay | Admitting: Cardiology

## 2020-08-30 ENCOUNTER — Other Ambulatory Visit: Payer: Self-pay

## 2020-08-30 ENCOUNTER — Ambulatory Visit: Payer: Managed Care, Other (non HMO) | Admitting: Cardiology

## 2020-08-30 VITALS — BP 142/90 | HR 81 | Ht 74.0 in | Wt 254.8 lb

## 2020-08-30 DIAGNOSIS — I10 Essential (primary) hypertension: Secondary | ICD-10-CM | POA: Diagnosis not present

## 2020-08-30 DIAGNOSIS — I2510 Atherosclerotic heart disease of native coronary artery without angina pectoris: Secondary | ICD-10-CM | POA: Diagnosis not present

## 2020-08-30 DIAGNOSIS — E782 Mixed hyperlipidemia: Secondary | ICD-10-CM | POA: Diagnosis not present

## 2020-08-30 DIAGNOSIS — E669 Obesity, unspecified: Secondary | ICD-10-CM

## 2020-08-30 MED ORDER — LOSARTAN POTASSIUM-HCTZ 50-12.5 MG PO TABS: 1.0000 | ORAL_TABLET | Freq: Every day | ORAL | 12 refills | Status: DC

## 2020-08-30 NOTE — Progress Notes (Signed)
Cardiology Office Note:    Date:  08/30/2020   ID:  Ethan Chapman, DOB 1966-04-15, MRN [id number]  PCP:  Ethan Auer, MD  Cardiologist:  Ethan Brothers, MD   Referring MD: Ethan Auer, MD    ASSESSMENT:    1. Atherosclerosis of native coronary artery of native heart without angina pectoris   2. Mixed hypercholesterolemia and hypertriglyceridemia   3. Essential hypertension   4. Obesity (BMI 30.0-34.9)    PLAN:    In order of problems listed above:  1. Coronary atherosclerosis: Secondary prevention stressed with the patient.  Importance of compliance with diet medication stressed any vocalized understanding.  I advised him to walk at least half an hour a day on a daily basis and he promises to do so. 2. Essential hypertension: Blood pressure is well controlled but he has issues with beta-blocker as mentioned below.  I will stop his beta-blocker and start him on losartan hydrochlorothiazide.  Potassium supplementation and diet was encouraged he will keep a track of his blood pressure log and bring it to [place] in 2 weeks at which time he will have a Chem-7.  Follow-up appointment will be in a month or earlier if he has any concerns.  Lifestyle modification was urged. 3. Mixed dyslipidemia: Lipids were reviewed.  He is on statin therapy. 4. Obesity: Weight reduction stressed lifestyle modification was urged diet was emphasized.  He had multiple questions which were answered to his satisfaction.   Medication Adjustments/Labs and Tests Ordered: Current medicines are reviewed at length with the patient today.  Concerns regarding medicines are outlined above.  No orders of the defined types were placed in this encounter.  No orders of the defined types were placed in this encounter.    No chief complaint on file.    History of Present Illness:    Ethan Chapman is a 55 y.o. male.  Patient has past medical history of coronary atherosclerosis, essential hypertension and  dyslipidemia.  He was recently treated for uncontrolled hypertension.  Now he mentions to me that metoprolol is not working well for him as he is having fatigue and significant erectile dysfunction he requests ACE inhibitor.  No chest pain orthopnea or PND.  At the time of my evaluation is alert awake oriented and in no distress.  Past Medical History:  Diagnosis Date  . Abnormal nuclear stress test 08/24/2019  . Coronary atherosclerosis 06/18/2019  . Essential hypertension 06/01/2020  . Headache 08/03/2020  . Mixed hypercholesterolemia and hypertriglyceridemia   . Obesity (BMI 30.0-34.9) 06/01/2020  . Recurrent UTI     Past Surgical History:  Procedure Laterality Date  . HYPOSPADIAS CORRECTION    . LUMBAR DISC SURGERY  2005   L4-5 left sded microdiscectomy    Current Medications: Current Meds  Medication Sig  . amLODipine (NORVASC) 10 MG tablet Take 10 mg by mouth daily.  . Cinnamon 500 MG capsule Take 500 mg by mouth daily.  . metoprolol succinate (TOPROL-XL) 50 MG 24 hr tablet Take 50 mg by mouth daily.  . nitrofurantoin (MACRODANTIN) 100 MG capsule Take 100 mg by mouth daily.  Marland Kitchen omega-3 acid ethyl esters (LOVAZA) 1 g capsule Take 2 capsules (2 g total) by mouth 2 (two) times daily.  Marland Kitchen omeprazole (PRILOSEC) 40 MG capsule Take 40 mg by mouth daily.  . rosuvastatin (CRESTOR) 10 MG tablet Take 10 mg by mouth daily.  Marland Kitchen testosterone cypionate (DEPOTESTOSTERONE CYPIONATE) 200 MG/ML injection Inject 0.6 mLs into the muscle  as directed. Every 10 days     Allergies:   Naproxen   Social History   Socioeconomic History  . Marital status: Married    Spouse name: Not on file  . Number of children: Not on file  . Years of education: Not on file  . Highest education level: Not on file  Occupational History  . Not on file  Tobacco Use  . Smoking status: Never Smoker  . Smokeless tobacco: Former Clinical biochemist  . Vaping Use: Never used  Substance and Sexual Activity  . Alcohol use:  Yes    Comment: Rare, special occasions  . Drug use: Never  . Sexual activity: Not on file  Other Topics Concern  . Not on file  Social History Narrative  . Not on file   Social Determinants of Health   Financial Resource Strain: Not on file  Food Insecurity: Not on file  Transportation Needs: Not on file  Physical Activity: Not on file  Stress: Not on file  Social Connections: Not on file     Family History: The patient's family history includes Dementia in his mother; Hypertension in his father.  ROS:   Please see the history of present illness.    All other systems reviewed and are negative.  EKGs/Labs/Other Studies Reviewed:    The following studies were reviewed today: I reviewed records and evaluation  Apnea   Recent Labs: 06/06/2020: ALT 69; TSH 0.940 08/03/2020: BUN 11; Creatinine, Ser 0.87; Hemoglobin 16.6; Platelets 346; Potassium 4.6; Sodium 136  Recent Lipid Panel    Component Value Date/Time   CHOL 143 06/06/2020 0853   TRIG 139 06/06/2020 0853   HDL 35 (L) 06/06/2020 0853   CHOLHDL 4.1 06/06/2020 0853   LDLCALC 83 06/06/2020 0853    Physical Exam:    VS:  BP (!) 142/90   Pulse 81   Ht 6\' 2"  (1.88 m)   Wt 254 lb 12.8 oz (115.6 kg)   SpO2 97%   BMI 32.71 kg/m     Wt Readings from Last 3 Encounters:  08/30/20 254 lb 12.8 oz (115.6 kg)  08/03/20 253 lb (114.8 kg)  06/01/20 260 lb 12.8 oz (118.3 kg)     GEN: Patient is in no acute distress HEENT: Normal NECK: No JVD; No carotid bruits LYMPHATICS: No lymphadenopathy CARDIAC: Hear sounds regular, 2/6 systolic murmur at the apex. RESPIRATORY:  Clear to auscultation without rales, wheezing or rhonchi  ABDOMEN: Soft, non-tender, non-distended MUSCULOSKELETAL:  No edema; No deformity  SKIN: Warm and dry NEUROLOGIC:  Alert and oriented x 3 PSYCHIATRIC:  Normal affect   Signed, 06/03/20, MD  08/30/2020 9:56 AM     [facility]

## 2020-08-30 NOTE — Patient Instructions (Addendum)
Medication Instructions:  Your physician has recommended you make the following change in your medication:   Stop Toprol Start Losartan HCTZ 50-12.5 mg daily. Keep a BP log and bring it with you at your blood draw.  *If you need a refill on your cardiac medications before your next appointment, please call your pharmacy*   Lab Work: Your physician recommends that you return for lab work in: 1 week for a BMET. You do not need an appointment or to fast.  If you have labs (blood work) drawn today and your tests are completely normal, you will receive your results only by: Marland Kitchen MyChart Message (if you have MyChart) OR . A paper copy in the mail If you have any lab test that is abnormal or we need to change your treatment, we will call you to review the results.   Testing/Procedures: None ordered   Follow-Up: At [facility], you and your health needs are our priority.  As part of our continuing mission to provide you with exceptional heart care, we have created designated Provider Care Teams.  These Care Teams include your primary Cardiologist (physician) and Advanced Practice Providers (APPs -  Physician Assistants and Nurse Practitioners) who all work together to provide you with the care you need, when you need it.  We recommend signing up for the patient portal called "MyChart".  Sign up information is provided on this After Visit Summary.  MyChart is used to connect with patients for Virtual Visits (Telemedicine).  Patients are able to view lab/test results, encounter notes, upcoming appointments, etc.  Non-urgent messages can be sent to your provider as well.   To learn more about what you can do with MyChart, go to [email or web address].    Your next appointment:   1 month(s)  The format for your next appointment:   In Person  Provider:   Belva Crome, MD   Other Instructions Losartan; Hydrochlorothiazide, HCTZ Oral Tablets What is this medicine? LOSARTAN;  HYDROCHLOROTHIAZIDE (loe SAR tan; hye droe klor oh THYE a zide) is a combination of an angiotensin II receptor blocker and a diuretic. It treats high blood pressure. It may also be used to lower the risk of stroke. This medicine may be used for other purposes; ask your health care provider or pharmacist if you have questions. COMMON BRAND NAME(S): Hyzaar What should I tell my health care provider before I take this medicine? They need to know if you have any of these conditions:  decreased urine  diabetes  kidney disease  liver disease  if you are on a special diet, like a low-salt diet  immune system problems, like lupus  an unusual or allergic reaction to losartan, hydrochlorothiazide, sulfa drugs, other medicines, foods, dyes, or preservatives  pregnant or trying to get pregnant  breast-feeding How should I use this medicine? Take this drug by mouth. Take it as directed on the prescription label at the same time every day. You can take it with or without food. If it upsets your stomach, take it with food. Keep taking it unless your health care provider tells you to stop. Talk to your health care provider about the use of this drug in children. Special care may be needed. Overdosage: If you think you have taken too much of this medicine contact a poison control center or emergency room at once. NOTE: This medicine is only for you. Do not share this medicine with others. What if I miss a dose? If you miss a dose, take  it as soon as you can. If it is almost time for your next dose, take only that dose. Do not take double or extra doses. What may interact with this medicine? This medicine may also interact with the following medications:  barbiturates, like phenobarbital  blood pressure medicines  celecoxib  cimetidine  corticosteroids  diabetic medicines  diuretics, especially triamterene, spironolactone or amiloride  fluconazole  lithium  NSAIDs, medicines for pain  and inflammation, like ibuprofen or naproxen  potassium salts or potassium supplements  prescription pain medicines  rifampin  skeletal muscle relaxants like tubocurarine  some cholesterol-lowering medicines like cholestyramine or colestipol This list may not describe all possible interactions. Give your health care provider a list of all the medicines, herbs, non-prescription drugs, or dietary supplements you use. Also tell them if you smoke, drink alcohol, or use illegal drugs. Some items may interact with your medicine. What should I watch for while using this medicine? Check your blood pressure regularly while you are taking this medicine. Ask your doctor or health care professional what your blood pressure should be and when you should contact him or her. When you check your blood pressure, write down the measurements to show your doctor or health care professional. If you are taking this medicine for a long time, you must visit your health care professional for regular checks on your progress. Make sure you schedule appointments on a regular basis. You must not get dehydrated. Ask your doctor or health care professional how much fluid you need to drink a day. Check with him or her if you get an attack of severe diarrhea, nausea and vomiting, or if you sweat a lot. The loss of too much body fluid can make it dangerous for you to take this medicine. Women should inform their doctor if they wish to become pregnant or think they might be pregnant. There is a potential for serious side effects to an unborn child, particularly in the second or third trimester. Talk to your health care professional or pharmacist for more information. You may get drowsy or dizzy. Do not drive, use machinery, or do anything that needs mental alertness until you know how this drug affects you. Do not stand or sit up quickly, especially if you are an older patient. This reduces the risk of dizzy or fainting spells.  Alcohol can make you more drowsy and dizzy. Avoid alcoholic drinks. This medicine may increase blood sugar. Ask your healthcare provider if changes in diet or medicines are needed if you have diabetes. Talk to your health care professional about your risk of skin cancer. You may be more at risk for skin cancer if you take this medicine. This medicine can make you more sensitive to the sun. Keep out of the sun. If you cannot avoid being in the sun, wear protective clothing and use sunscreen. Do not use sun lamps or tanning beds/booths. Avoid salt substitutes unless you are told otherwise by your doctor or health care professional. Do not treat yourself for coughs, colds, or pain while you are taking this medicine without asking your doctor or health care professional for advice. Some ingredients may increase your blood pressure. What side effects may I notice from receiving this medicine? Side effects that you should report to your doctor or health care professional as soon as possible:  allergic reactions like skin rash, itching or hives, swelling of the face, lips, or tongue  breathing problems  changes in vision  dark urine  eye pain  fast or irregular heart beat, palpitations, or chest pain  feeling faint or lightheaded  muscle cramps  persistent dry cough  redness, blistering, peeling or loosening of the skin, including inside the mouth  signs and symptoms of high blood sugar such as being more thirsty or hungry or having to urinate more than normal. You may also feel very tired or have blurry vision.  stomach pain  trouble passing urine  unusual bleeding or bruising  worsened gout pain  yellowing of the eyes or skin Side effects that usually do not require medical attention (report to your doctor or health care professional if they continue or are bothersome):  change in sex drive or performance  headache This list may not describe all possible side effects. Call  your doctor for medical advice about side effects. You may report side effects to FDA at 1-800-FDA-1088. Where should I keep my medicine? Keep out of the reach of children and pets. Store at room temperature between 15 and 30 degrees C (59 and 86 degrees F). Protect from light. Keep the container tightly closed. Throw away any unused drug after the expiration date. NOTE: This sheet is a summary. It may not cover all possible information. If you have questions about this medicine, talk to your doctor, pharmacist, or health care provider.  2021 Elsevier/Gold Standard (2019-02-23 17:07:13)  Blood Pressure Record Sheet To take your blood pressure, you will need a blood pressure machine. You can buy a blood pressure machine (blood pressure monitor) at your clinic, drug store, or online. When choosing one, consider:  An automatic monitor that has an arm cuff.  A cuff that wraps snugly around your upper arm. You should be able to fit only one finger between your arm and the cuff.  A device that stores blood pressure reading results.  Do not choose a monitor that measures your blood pressure from your wrist or finger. Follow your health care provider's instructions for how to take your blood pressure. To use this form:  Get one reading in the morning (a.m.) 1-2 hours after you take any medicines.  Get one reading in the evening (p.m.) before supper.  Take at least 2 readings with each blood pressure check. This makes sure the results are correct. Wait 1-2 minutes between measurements.  Write down the results in the spaces on this form.  Repeat this once a week, or as told by your health care provider.    Make a follow-up appointment with your health care provider to discuss the results. Blood pressure log Date: _______________________  a.m. _____________________(1st reading) _____________________(2nd reading)  p.m. _____________________(1st reading) _____________________(2nd  reading) Date: _______________________  a.m. _____________________(1st reading) _____________________(2nd reading)  p.m. _____________________(1st reading) _____________________(2nd reading) Date: _______________________  a.m. _____________________(1st reading) _____________________(2nd reading)  p.m. _____________________(1st reading) _____________________(2nd reading) Date: _______________________  a.m. _____________________(1st reading) _____________________(2nd reading)  p.m. _____________________(1st reading) _____________________(2nd reading) Date: _______________________  a.m. _____________________(1st reading) _____________________(2nd reading)  p.m. _____________________(1st reading) _____________________(2nd reading) This information is not intended to replace advice given to you by your health care provider. Make sure you discuss any questions you have with your health care provider. Document Revised: 07/21/2019 Document Reviewed: 07/21/2019 Elsevier Patient Education  2021 [employer].

## 2020-09-13 LAB — BASIC METABOLIC PANEL
BUN/Creatinine Ratio: 12 (ref 9–20)
BUN: 11 mg/dL (ref 6–24)
CO2: 22 mmol/L (ref 20–29)
Calcium: 9 mg/dL (ref 8.7–10.2)
Chloride: 98 mmol/L (ref 96–106)
Creatinine, Ser: 0.89 mg/dL (ref 0.76–1.27)
Glucose: 163 mg/dL — ABNORMAL HIGH (ref 65–99)
Potassium: 4.3 mmol/L (ref 3.5–5.2)
Sodium: 136 mmol/L (ref 134–144)
eGFR: 102 mL/min/{1.73_m2} (ref >59)

## 2020-09-29 ENCOUNTER — Other Ambulatory Visit: Payer: Self-pay

## 2020-10-02 ENCOUNTER — Ambulatory Visit (INDEPENDENT_AMBULATORY_CARE_PROVIDER_SITE_OTHER): Payer: Managed Care, Other (non HMO) | Admitting: Cardiology

## 2020-10-02 ENCOUNTER — Other Ambulatory Visit: Payer: Self-pay

## 2020-10-02 ENCOUNTER — Encounter: Payer: Self-pay | Admitting: Cardiology

## 2020-10-02 VITALS — BP 124/66 | HR 80 | Ht 73.0 in | Wt 257.2 lb

## 2020-10-02 DIAGNOSIS — R9439 Abnormal result of other cardiovascular function study: Secondary | ICD-10-CM

## 2020-10-02 DIAGNOSIS — I10 Essential (primary) hypertension: Secondary | ICD-10-CM

## 2020-10-02 DIAGNOSIS — E782 Mixed hyperlipidemia: Secondary | ICD-10-CM | POA: Diagnosis not present

## 2020-10-02 DIAGNOSIS — I2510 Atherosclerotic heart disease of native coronary artery without angina pectoris: Secondary | ICD-10-CM

## 2020-10-02 DIAGNOSIS — E669 Obesity, unspecified: Secondary | ICD-10-CM

## 2020-10-02 NOTE — Patient Instructions (Signed)

## 2020-10-02 NOTE — Progress Notes (Signed)
Cardiology Office Note:    Date:  10/02/2020   ID:  [name], DOB 11/11/1965, MRN [id number]  PCP:  Lise Auer, MD  Cardiologist:  Garwin Brothers, MD   Referring MD: Lise Auer, MD    ASSESSMENT:    1. Atherosclerosis of native coronary artery of native heart without angina pectoris   2. Essential hypertension   3. Mixed hypercholesterolemia and hypertriglyceridemia   4. Abnormal nuclear stress test   5. Obesity (BMI 30.0-34.9)    PLAN:    In order of problems listed above:  Coronary atherosclerosis: Secondary prevention stressed to the patient.  Importance of compliance with diet medication stressed any vocalized understanding. Essential hypertension: Blood pressure stable and diet was emphasized.  Lifestyle modification urged.  Blood work report was discussed with him. Mixed dyslipidemia and obesity: Weight reduction stressed.  Lifestyle modification urged.  Recent lipids were reviewed. Patient will be seen in follow-up appointment in 6 months or earlier if the patient has any concerns    Medication Adjustments/Labs and Tests Ordered: Current medicines are reviewed at length with the patient today.  Concerns regarding medicines are outlined above.  No orders of the defined types were placed in this encounter.  No orders of the defined types were placed in this encounter.    No chief complaint on file.    History of Present Illness:    Ethan Chapman is a 55 y.o. male.  Patient has past medical history of coronary atherosclerosis, essential hypertension mixed dyslipidemia and obesity.  He denies any problems at this time and takes care of activities of daily living.  No chest pain orthopnea or PND.  At the time of my evaluation, the patient is alert awake oriented and in no distress.  Past Medical History:  Diagnosis Date   Abnormal nuclear stress test 08/24/2019   Coronary atherosclerosis 06/18/2019   Essential hypertension 06/01/2020   Headache  08/03/2020   Mixed hypercholesterolemia and hypertriglyceridemia    Obesity (BMI 30.0-34.9) 06/01/2020   Recurrent UTI     Past Surgical History:  Procedure Laterality Date   HYPOSPADIAS CORRECTION     LUMBAR DISC SURGERY  2005   L4-5 left sded microdiscectomy    Current Medications: Current Meds  Medication Sig   amLODipine (NORVASC) 10 MG tablet Take 10 mg by mouth daily.   Cinnamon 500 MG capsule Take 500 mg by mouth daily.   losartan-hydrochlorothiazide (HYZAAR) 50-12.5 MG tablet Take 1 tablet by mouth daily.   nitrofurantoin (MACRODANTIN) 100 MG capsule Take 100 mg by mouth daily.   omega-3 acid ethyl esters (LOVAZA) 1 g capsule Take 2 capsules (2 g total) by mouth 2 (two) times daily.   omeprazole (PRILOSEC) 40 MG capsule Take 40 mg by mouth daily.   rosuvastatin (CRESTOR) 10 MG tablet Take 10 mg by mouth daily.   tadalafil (CIALIS) 5 MG tablet Take 5 mg by mouth daily.   testosterone cypionate (DEPOTESTOSTERONE CYPIONATE) 200 MG/ML injection Inject 0.6 mLs into the muscle as directed. Every 10 days     Allergies:   Naproxen   Social History   Socioeconomic History   Marital status: Married    Spouse name: Not on file   Number of children: Not on file   Years of education: Not on file   Highest education level: Not on file  Occupational History   Not on file  Tobacco Use   Smoking status: Never   Smokeless tobacco: Former    Quit  date: 06/18/2003  Vaping Use   Vaping Use: Never used  Substance and Sexual Activity   Alcohol use: Yes    Comment: Rare, special occasions   Drug use: Never   Sexual activity: Not on file  Other Topics Concern   Not on file  Social History Narrative   Not on file   Social Determinants of Health   Financial Resource Strain: Not on file  Food Insecurity: Not on file  Transportation Needs: Not on file  Physical Activity: Not on file  Stress: Not on file  Social Connections: Not on file     Family History: The patient's family  history includes Dementia in his mother; Hypertension in his father.  ROS:   Please see the history of present illness.    All other systems reviewed and are negative.  EKGs/Labs/Other Studies Reviewed:    The following studies were reviewed today: I discussed my findings with the patient at extensive length   Recent Labs: 06/06/2020: ALT 69; TSH 0.940 08/03/2020: Hemoglobin 16.6; Platelets 346 09/13/2020: BUN 11; Creatinine, Ser 0.89; Potassium 4.3; Sodium 136  Recent Lipid Panel    Component Value Date/Time   CHOL 143 06/06/2020 0853   TRIG 139 06/06/2020 0853   HDL 35 (L) 06/06/2020 0853   CHOLHDL 4.1 06/06/2020 0853   LDLCALC 83 06/06/2020 0853    Physical Exam:    VS:  BP 124/66   Pulse 80   Ht 6\' 1"  (1.854 m)   Wt 257 lb 3.2 oz (116.7 kg)   SpO2 96%   BMI 33.93 kg/m     Wt Readings from Last 3 Encounters:  10/02/20 257 lb 3.2 oz (116.7 kg)  08/30/20 254 lb 12.8 oz (115.6 kg)  08/03/20 253 lb (114.8 kg)     GEN: Patient is in no acute distress HEENT: Normal NECK: No JVD; No carotid bruits LYMPHATICS: No lymphadenopathy CARDIAC: Hear sounds regular, 2/6 systolic murmur at the apex. RESPIRATORY:  Clear to auscultation without rales, wheezing or rhonchi  ABDOMEN: Soft, non-tender, non-distended MUSCULOSKELETAL:  No edema; No deformity  SKIN: Warm and dry NEUROLOGIC:  Alert and oriented x 3 PSYCHIATRIC:  Normal affect   Signed, 08/05/20, MD  10/02/2020 9:50 AM    [facility]

## 2020-10-04 ENCOUNTER — Other Ambulatory Visit: Payer: Self-pay | Admitting: Cardiology

## 2020-10-05 NOTE — Telephone Encounter (Signed)
Refill sent to pharmacy.   

## 2020-12-11 ENCOUNTER — Other Ambulatory Visit: Payer: Self-pay | Admitting: Cardiology

## 2021-03-13 ENCOUNTER — Other Ambulatory Visit: Payer: Self-pay | Admitting: Cardiology

## 2021-03-19 DIAGNOSIS — N401 Enlarged prostate with lower urinary tract symptoms: Secondary | ICD-10-CM | POA: Diagnosis not present

## 2021-03-19 DIAGNOSIS — N529 Male erectile dysfunction, unspecified: Secondary | ICD-10-CM | POA: Diagnosis not present

## 2021-03-19 DIAGNOSIS — Z79899 Other long term (current) drug therapy: Secondary | ICD-10-CM | POA: Diagnosis not present

## 2021-03-19 DIAGNOSIS — E291 Testicular hypofunction: Secondary | ICD-10-CM | POA: Diagnosis not present

## 2021-03-19 DIAGNOSIS — N3090 Cystitis, unspecified without hematuria: Secondary | ICD-10-CM | POA: Diagnosis not present

## 2021-03-22 DIAGNOSIS — R7301 Impaired fasting glucose: Secondary | ICD-10-CM | POA: Diagnosis not present

## 2021-03-30 DIAGNOSIS — Z6832 Body mass index (BMI) 32.0-32.9, adult: Secondary | ICD-10-CM | POA: Diagnosis not present

## 2021-03-30 DIAGNOSIS — E1165 Type 2 diabetes mellitus with hyperglycemia: Secondary | ICD-10-CM | POA: Diagnosis not present

## 2021-04-03 ENCOUNTER — Other Ambulatory Visit: Payer: Self-pay | Admitting: Cardiology

## 2021-04-04 ENCOUNTER — Telehealth: Payer: Self-pay

## 2021-04-04 NOTE — Telephone Encounter (Signed)
PA started on CMM for Lovaza 1g. Key [id number]

## 2021-04-06 ENCOUNTER — Telehealth: Payer: Self-pay | Admitting: Cardiology

## 2021-04-06 NOTE — Telephone Encounter (Signed)
Ethan Chapman IS CALLING TO ADVISE THAT PT NEEDS PA FOR THE MEDICINE OMEGA 3 ACID ETHYL ESTERS Ethan SAID THE OFFICE IS CLOSED UNTIL TUESDAY

## 2021-04-13 ENCOUNTER — Encounter: Payer: Self-pay | Admitting: Cardiology

## 2021-04-15 HISTORY — PX: KNEE ARTHROSCOPY: SHX127

## 2021-04-17 NOTE — Telephone Encounter (Signed)
PA denied on CMM. 

## 2021-05-01 ENCOUNTER — Encounter: Payer: Self-pay | Admitting: Cardiology

## 2021-05-01 DIAGNOSIS — B079 Viral wart, unspecified: Secondary | ICD-10-CM | POA: Diagnosis not present

## 2021-05-01 MED ORDER — ROSUVASTATIN CALCIUM 10 MG PO TABS: ORAL_TABLET | ORAL | 3 refills | Status: DC

## 2021-05-01 MED ORDER — AMLODIPINE BESYLATE 10 MG PO TABS: 10.0000 mg | ORAL_TABLET | Freq: Every day | ORAL | 3 refills | Status: DC

## 2021-05-01 MED ORDER — LOSARTAN POTASSIUM-HCTZ 50-12.5 MG PO TABS: 1.0000 | ORAL_TABLET | Freq: Every day | ORAL | 3 refills | Status: DC

## 2021-05-07 ENCOUNTER — Other Ambulatory Visit: Payer: Self-pay

## 2021-05-09 ENCOUNTER — Encounter: Payer: Self-pay | Admitting: Cardiology

## 2021-05-09 ENCOUNTER — Other Ambulatory Visit: Payer: Self-pay

## 2021-05-09 ENCOUNTER — Ambulatory Visit (INDEPENDENT_AMBULATORY_CARE_PROVIDER_SITE_OTHER): Payer: BC Managed Care – PPO | Admitting: Cardiology

## 2021-05-09 VITALS — BP 120/80 | HR 80 | Ht 73.0 in | Wt 257.0 lb

## 2021-05-09 DIAGNOSIS — I2510 Atherosclerotic heart disease of native coronary artery without angina pectoris: Secondary | ICD-10-CM | POA: Diagnosis not present

## 2021-05-09 DIAGNOSIS — E782 Mixed hyperlipidemia: Secondary | ICD-10-CM | POA: Diagnosis not present

## 2021-05-09 DIAGNOSIS — I10 Essential (primary) hypertension: Secondary | ICD-10-CM | POA: Diagnosis not present

## 2021-05-09 DIAGNOSIS — E669 Obesity, unspecified: Secondary | ICD-10-CM

## 2021-05-09 DIAGNOSIS — E66811 Obesity, class 1: Secondary | ICD-10-CM

## 2021-05-09 NOTE — Progress Notes (Signed)
Cardiology Office Note:    Date:  05/09/2021   ID:  Ethan Chapman, DOB [birth year], MRN [id number]  PCP:  Lise Auer, MD  Cardiologist:  Garwin Brothers, MD   Referring MD: Lise Auer, MD    ASSESSMENT:    1. Atherosclerosis of native coronary artery of native heart without angina pectoris   2. Essential hypertension   3. Mixed hypercholesterolemia and hypertriglyceridemia   4. Obesity (BMI 30.0-34.9)    PLAN:    In order of problems listed above:  Coronary artery disease: Secondary prevention stressed with the patient.  Importance of compliance with diet medication stressed and he vocalized understanding.  He was advised to walk at least half an hour a day 5 days a week and he promises to do so. Mixed dyslipidemia: I discussed this with the patient at length.  I reviewed [facility] notes and triglycerides are 480.  He stopped taking Lovaza.  He will contact his insurance company for them to approve it.  If not he will take fish oil and we will also initiate treatment with the fenofibrate's.  He will get back to [place] about this issue. Essential hypertension: Blood pressure stable and diet is emphasized. Obesity: Weight reduction stressed.  He also has been diagnosed to have diabetes and hemoglobin A1c greater than 8 and is working extensively on getting this corrected. Patient will be seen in follow-up appointment in 6 months or earlier if the patient has any concerns    Medication Adjustments/Labs and Tests Ordered: Current medicines are reviewed at length with the patient today.  Concerns regarding medicines are outlined above.  No orders of the defined types were placed in this encounter.  No orders of the defined types were placed in this encounter.    Chief Complaint  Patient presents with   Follow-up     History of Present Illness:    Ethan Chapman is a 56 y.o. male.  Patient has past medical history of coronary artery disease, essential hypertension, mixed  dyslipidemia and obesity.  Overall he leads a sedentary lifestyle.  He denies any chest pain orthopnea or PND.  At the time of my evaluation, the patient is alert awake oriented and in no distress.  Past Medical History:  Diagnosis Date   Abnormal nuclear stress test 08/24/2019   Coronary atherosclerosis 06/18/2019   Essential hypertension 06/01/2020   Headache 08/03/2020   Mixed hypercholesterolemia and hypertriglyceridemia    Obesity (BMI 30.0-34.9) 06/01/2020   Recurrent UTI     Past Surgical History:  Procedure Laterality Date   HYPOSPADIAS CORRECTION     LUMBAR DISC SURGERY  2005   L4-5 left sded microdiscectomy    Current Medications: Current Meds  Medication Sig   amLODipine (NORVASC) 10 MG tablet Take 1 tablet (10 mg total) by mouth daily.   Cinnamon 500 MG capsule Take 500 mg by mouth daily.   losartan-hydrochlorothiazide (HYZAAR) 50-12.5 MG tablet Take 1 tablet by mouth daily.   metFORMIN (GLUCOPHAGE-XR) 750 MG 24 hr tablet Take 750 mg by mouth daily.   metoprolol succinate (TOPROL-XL) 50 MG 24 hr tablet Take 50 mg by mouth daily.   nitrofurantoin (MACRODANTIN) 100 MG capsule Take 100 mg by mouth daily.   omeprazole (PRILOSEC) 40 MG capsule Take 40 mg by mouth daily.   rosuvastatin (CRESTOR) 10 MG tablet Take 10 mg by mouth daily.   tadalafil (CIALIS) 5 MG tablet Take 5 mg by mouth daily.   testosterone cypionate (DEPOTESTOSTERONE CYPIONATE) 200  MG/ML injection Inject 0.6 mLs into the muscle as directed. Every 10 days     Allergies:   Naproxen   Social History   Socioeconomic History   Marital status: Married    Spouse name: Not on file   Number of children: Not on file   Years of education: Not on file   Highest education level: Not on file  Occupational History   Not on file  Tobacco Use   Smoking status: Never   Smokeless tobacco: Former    Quit date: 06/18/2003  Vaping Use   Vaping Use: Never used  Substance and Sexual Activity   Alcohol use: Yes     Comment: Rare, special occasions   Drug use: Never   Sexual activity: Not on file  Other Topics Concern   Not on file  Social History Narrative   Not on file   Social Determinants of Health   Financial Resource Strain: Not on file  Food Insecurity: Not on file  Transportation Needs: Not on file  Physical Activity: Not on file  Stress: Not on file  Social Connections: Not on file     Family History: The patient's family history includes Dementia in his mother; Hypertension in his father.  ROS:   Please see the history of present illness.    All other systems reviewed and are negative.  EKGs/Labs/Other Studies Reviewed:    The following studies were reviewed today: I discussed my findings with the patient at length.   Recent Labs: 06/06/2020: ALT 69; TSH 0.940 08/03/2020: Hemoglobin 16.6; Platelets 346 09/13/2020: BUN 11; Creatinine, Ser 0.89; Potassium 4.3; Sodium 136  Recent Lipid Panel    Component Value Date/Time   CHOL 143 06/06/2020 0853   TRIG 139 06/06/2020 0853   HDL 35 (L) 06/06/2020 0853   CHOLHDL 4.1 06/06/2020 0853   LDLCALC 83 06/06/2020 0853    Physical Exam:    VS:  BP 120/80 (BP Location: Right Arm, Patient Position: Sitting, Cuff Size: Normal)    Pulse 80    Ht 6\' 1"  (1.854 m)    Wt 257 lb (116.6 kg)    SpO2 95%    BMI 33.91 kg/m     Wt Readings from Last 3 Encounters:  05/09/21 257 lb (116.6 kg)  10/02/20 257 lb 3.2 oz (116.7 kg)  08/30/20 254 lb 12.8 oz (115.6 kg)     GEN: Patient is in no acute distress HEENT: Normal NECK: No JVD; No carotid bruits LYMPHATICS: No lymphadenopathy CARDIAC: Hear sounds regular, 2/6 systolic murmur at the apex. RESPIRATORY:  Clear to auscultation without rales, wheezing or rhonchi  ABDOMEN: Soft, non-tender, non-distended MUSCULOSKELETAL:  No edema; No deformity  SKIN: Warm and dry NEUROLOGIC:  Alert and oriented x 3 PSYCHIATRIC:  Normal affect   Signed, 09/01/20, MD  05/09/2021 2:33 PM    [facility]

## 2021-05-09 NOTE — Patient Instructions (Signed)

## 2021-05-10 ENCOUNTER — Telehealth: Payer: Self-pay

## 2021-05-10 DIAGNOSIS — E782 Mixed hyperlipidemia: Secondary | ICD-10-CM

## 2021-05-10 DIAGNOSIS — I2510 Atherosclerotic heart disease of native coronary artery without angina pectoris: Secondary | ICD-10-CM

## 2021-05-10 NOTE — Telephone Encounter (Signed)
PA for Lovaza has been submitted. Key code [facility]

## 2021-05-18 MED ORDER — FENOFIBRATE 145 MG PO TABS: 145.0000 mg | ORAL_TABLET | Freq: Every day | ORAL | 3 refills | Status: DC

## 2021-05-18 NOTE — Telephone Encounter (Signed)
Lovaza was denied. We will try Fenofibrate

## 2021-05-18 NOTE — Addendum Note (Signed)
Addended by: Truddie Hidden on: 05/18/2021 07:41 AM   Modules accepted: Orders

## 2021-05-29 DIAGNOSIS — B079 Viral wart, unspecified: Secondary | ICD-10-CM | POA: Diagnosis not present

## 2021-06-22 DIAGNOSIS — E782 Mixed hyperlipidemia: Secondary | ICD-10-CM | POA: Diagnosis not present

## 2021-06-22 DIAGNOSIS — E291 Testicular hypofunction: Secondary | ICD-10-CM | POA: Diagnosis not present

## 2021-06-22 DIAGNOSIS — Z125 Encounter for screening for malignant neoplasm of prostate: Secondary | ICD-10-CM | POA: Diagnosis not present

## 2021-06-22 DIAGNOSIS — E1165 Type 2 diabetes mellitus with hyperglycemia: Secondary | ICD-10-CM | POA: Diagnosis not present

## 2021-06-27 DIAGNOSIS — B079 Viral wart, unspecified: Secondary | ICD-10-CM | POA: Diagnosis not present

## 2021-06-28 DIAGNOSIS — I10 Essential (primary) hypertension: Secondary | ICD-10-CM | POA: Diagnosis not present

## 2021-06-28 DIAGNOSIS — Z6832 Body mass index (BMI) 32.0-32.9, adult: Secondary | ICD-10-CM | POA: Diagnosis not present

## 2021-06-28 DIAGNOSIS — E1165 Type 2 diabetes mellitus with hyperglycemia: Secondary | ICD-10-CM | POA: Diagnosis not present

## 2021-06-28 DIAGNOSIS — I2510 Atherosclerotic heart disease of native coronary artery without angina pectoris: Secondary | ICD-10-CM | POA: Diagnosis not present

## 2021-07-11 DIAGNOSIS — B079 Viral wart, unspecified: Secondary | ICD-10-CM | POA: Diagnosis not present

## 2021-08-20 DIAGNOSIS — N390 Urinary tract infection, site not specified: Secondary | ICD-10-CM | POA: Diagnosis not present

## 2021-08-20 DIAGNOSIS — Z6832 Body mass index (BMI) 32.0-32.9, adult: Secondary | ICD-10-CM | POA: Diagnosis not present

## 2021-08-20 DIAGNOSIS — E1165 Type 2 diabetes mellitus with hyperglycemia: Secondary | ICD-10-CM | POA: Diagnosis not present

## 2021-09-05 DIAGNOSIS — K5790 Diverticulosis of intestine, part unspecified, without perforation or abscess without bleeding: Secondary | ICD-10-CM | POA: Diagnosis not present

## 2021-09-19 DIAGNOSIS — Z79899 Other long term (current) drug therapy: Secondary | ICD-10-CM | POA: Diagnosis not present

## 2021-09-19 DIAGNOSIS — N529 Male erectile dysfunction, unspecified: Secondary | ICD-10-CM | POA: Diagnosis not present

## 2021-09-19 DIAGNOSIS — N401 Enlarged prostate with lower urinary tract symptoms: Secondary | ICD-10-CM | POA: Diagnosis not present

## 2021-09-19 DIAGNOSIS — E291 Testicular hypofunction: Secondary | ICD-10-CM | POA: Diagnosis not present

## 2021-10-01 DIAGNOSIS — E1165 Type 2 diabetes mellitus with hyperglycemia: Secondary | ICD-10-CM | POA: Diagnosis not present

## 2021-10-01 DIAGNOSIS — I10 Essential (primary) hypertension: Secondary | ICD-10-CM | POA: Diagnosis not present

## 2021-10-01 DIAGNOSIS — I2510 Atherosclerotic heart disease of native coronary artery without angina pectoris: Secondary | ICD-10-CM | POA: Diagnosis not present

## 2021-10-01 DIAGNOSIS — E782 Mixed hyperlipidemia: Secondary | ICD-10-CM | POA: Diagnosis not present

## 2021-10-19 DIAGNOSIS — I10 Essential (primary) hypertension: Secondary | ICD-10-CM | POA: Diagnosis not present

## 2021-10-19 DIAGNOSIS — Z09 Encounter for follow-up examination after completed treatment for conditions other than malignant neoplasm: Secondary | ICD-10-CM | POA: Diagnosis not present

## 2021-10-19 DIAGNOSIS — E119 Type 2 diabetes mellitus without complications: Secondary | ICD-10-CM | POA: Diagnosis not present

## 2021-10-19 DIAGNOSIS — Z1211 Encounter for screening for malignant neoplasm of colon: Secondary | ICD-10-CM | POA: Diagnosis not present

## 2021-10-19 DIAGNOSIS — Z86010 Personal history of colonic polyps: Secondary | ICD-10-CM | POA: Diagnosis not present

## 2021-10-19 DIAGNOSIS — K5730 Diverticulosis of large intestine without perforation or abscess without bleeding: Secondary | ICD-10-CM | POA: Diagnosis not present

## 2021-12-10 DIAGNOSIS — Z6832 Body mass index (BMI) 32.0-32.9, adult: Secondary | ICD-10-CM | POA: Diagnosis not present

## 2021-12-10 DIAGNOSIS — H109 Unspecified conjunctivitis: Secondary | ICD-10-CM | POA: Diagnosis not present

## 2021-12-15 IMAGING — CT CT HEART MORP W/ CTA COR W/ SCORE W/ CA W/CM &/OR W/O CM
1 of 8 series · 7 of 20 positions shown, 9 images · IV contrast (APPLIED)
Comparison: 05/11/2019 CTA chest.
COMPARISON: 05/11/2019 CTA chest.

Addendum:
EXAM:
OVER-READ INTERPRETATION  CT CHEST

The following report is an over-read performed by radiologist Dr.
Dhajeet Addedroc [REDACTED] on 09/27/2019. This over-read
does not include interpretation of cardiac or coronary anatomy or
pathology. The coronary CTA interpretation by the cardiologist is
attached.
HISTORY: CAD monitoring, prior imaging or treadmill < 2yrs
Cardiac/Coronary  CT
TECHNIQUE: The patient was scanned on a Siemens Force scanner.
PROTOCOL: A 120 kV prospective scan was triggered in the descending thoracic
aorta at 111 HU's. Axial non-contrast 3 mm slices were carried out
through the heart. The data set was analyzed on a dedicated work
station and scored using the Agatson method. Gantry rotation speed
was 250 msecs and collimation was .6 mm. Beta blockade and 0.8 mg of
sl NTG was given. The 3D data set was reconstructed in 5% intervals
of the 67-82 % of the R-R cycle. Diastolic phases were analyzed on a
dedicated work station using MPR, MIP and VRT modes. The patient
received 80mL OMNIPAQUE IOHEXOL 350 MG/ML SOLN of contrast.

[Series 10: multiphase · axial · 0.39mm/px · z∈[-253,-155]mm · 7 of 1314 slices shown, 9 images]
[im 165/1314  vessel]
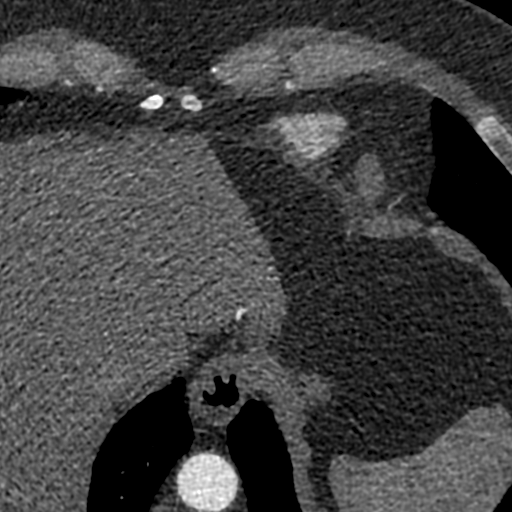
[im 165/1314  lung]
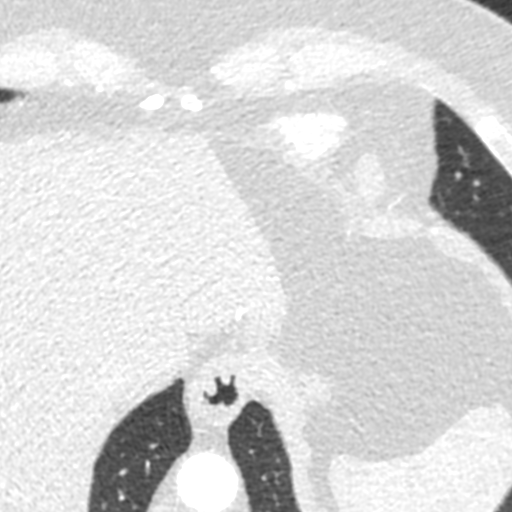
[im 329/1314  vessel]
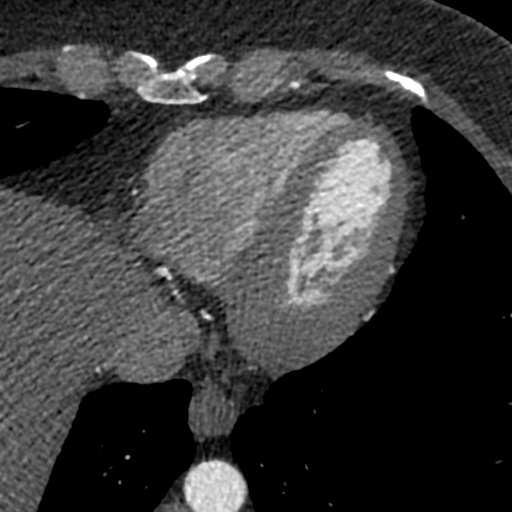
[im 493/1314  vessel]
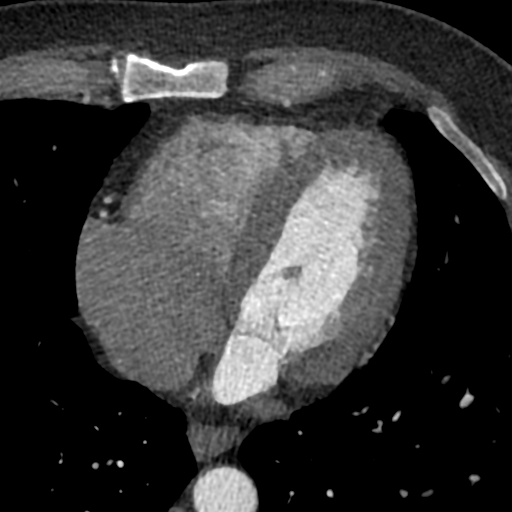
[im 657/1314  vessel]
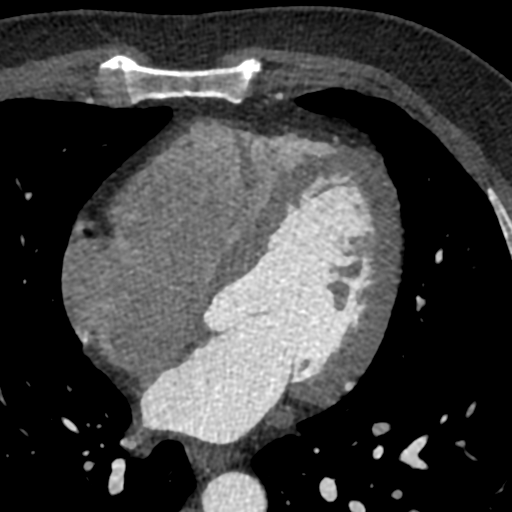
[im 821/1314  vessel]
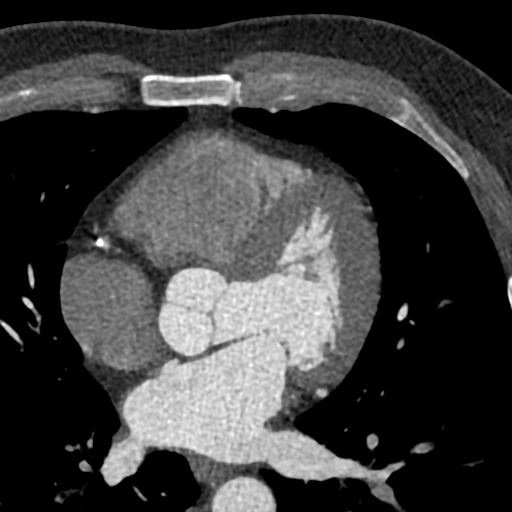
[im 821/1314  lung]
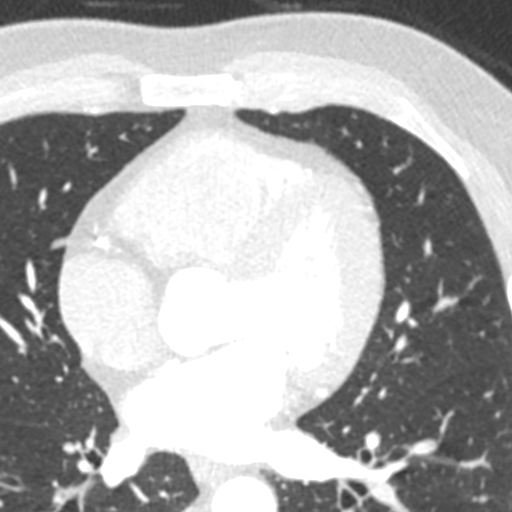
[im 985/1314  vessel]
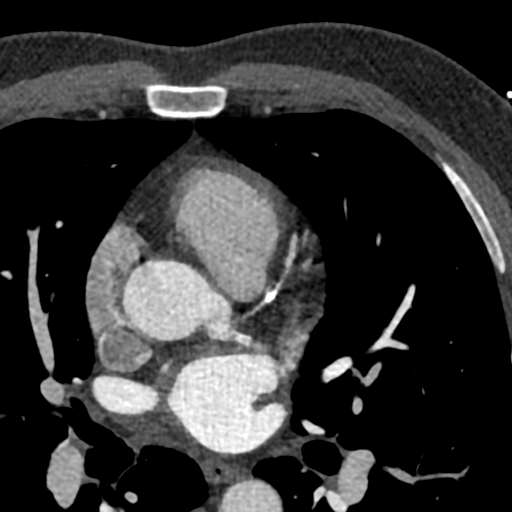
[im 1149/1314  vessel]
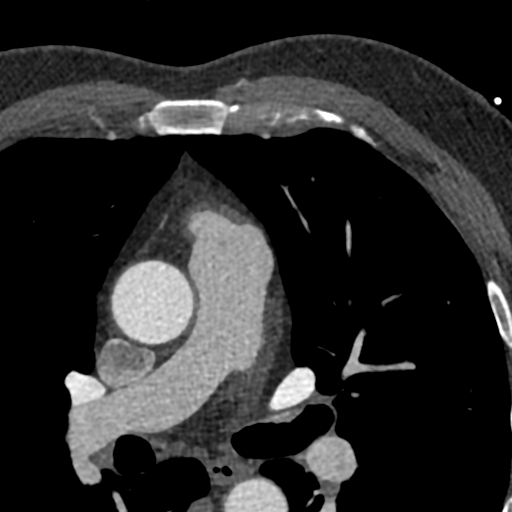

[7 of 20 positions shown; findings below may reference images not displayed]

FINDINGS: Vascular: Aortic atherosclerosis. No dissection. No central
pulmonary embolism, on this non-dedicated study.

Mediastinum/Nodes: No imaged thoracic adenopathy.

Lungs/Pleura: No pleural fluid. 4 mm nodule in the right minor
fissure on [DATE] is similar to on the prior exam.

Upper Abdomen: Hepatic steatosis. Normal imaged portions of the
spleen, stomach.

Musculoskeletal: No acute osseous abnormality.
IMPRESSION: 1. No acute findings in the imaged extracardiac chest.
2. Aortic Atherosclerosis (Z2SBZ-BHX.X).
3. 4 mm nodule along the right minor fissure is similar over nearly
5 months. No follow-up needed if patient is low-risk. Non-contrast
chest CT can be considered in 12 months if patient is high-risk.
This recommendation follows the consensus statement: Guidelines for
Management of Incidental Pulmonary Nodules Detected on CT Images:
FINDINGS: Motion artifact reduces the diagnostic accuracy of this exam.

Coronary calcium score is 690, which places the patient in the 98th
percentile for age and sex matched control.

Coronary arteries: Normal coronary origins.  Right dominance.

Right Coronary Artery: Assessment of proximal RCA limited by motion
artifact. Three serial moderate mixed atherosclerotic lesions in the
mid and distal RCA, 50-69% stenosis.

Left Main Coronary Artery: Minimal mixed atherosclerotic plaque in
the distal left main coronary artery, <25% stenosis.

Left Anterior Descending Coronary Artery: Mild mixed atherosclerotic
plaque in the proximal and mid LAD, 25-49% stenosis.

Left Circumflex Artery: Mild mixed atherosclerotic plaque in the
proximal left circumflex artery, 25-49% stenosis.

Aorta: Normal size, 32 mm at the mid ascending aorta (level of the
PA bifurcation) measured double oblique. No calcifications. No
dissection.

Aortic Valve: Trivial annular calcifications.

Other findings:

Normal pulmonary vein drainage into the left atrium.

Normal left atrial appendage without a thrombus.

Normal size of the pulmonary artery.
IMPRESSION: 1. Moderate CAD in the mid and distal RCA, CADRADS = 3. CT FFR will
be performed and reported separately.

2. Coronary calcium score is 690, which places the patient in the
98th percentile for age and sex matched control.

3. Normal coronary origin with right dominance.

*** End of Addendum ***
EXAM:
OVER-READ INTERPRETATION  CT CHEST

The following report is an over-read performed by radiologist Dr.
Dhajeet Addedroc [REDACTED] on 09/27/2019. This over-read
does not include interpretation of cardiac or coronary anatomy or
pathology. The coronary CTA interpretation by the cardiologist is
attached.
FINDINGS: Vascular: Aortic atherosclerosis. No dissection. No central
pulmonary embolism, on this non-dedicated study.

Mediastinum/Nodes: No imaged thoracic adenopathy.

Lungs/Pleura: No pleural fluid. 4 mm nodule in the right minor
fissure on [DATE] is similar to on the prior exam.

Upper Abdomen: Hepatic steatosis. Normal imaged portions of the
spleen, stomach.

Musculoskeletal: No acute osseous abnormality.
IMPRESSION: 1. No acute findings in the imaged extracardiac chest.
2. Aortic Atherosclerosis (Z2SBZ-BHX.X).
3. 4 mm nodule along the right minor fissure is similar over nearly
5 months. No follow-up needed if patient is low-risk. Non-contrast
chest CT can be considered in 12 months if patient is high-risk.
This recommendation follows the consensus statement: Guidelines for
Management of Incidental Pulmonary Nodules Detected on CT Images:

## 2021-12-19 DIAGNOSIS — E1165 Type 2 diabetes mellitus with hyperglycemia: Secondary | ICD-10-CM | POA: Diagnosis not present

## 2021-12-24 DIAGNOSIS — Z6832 Body mass index (BMI) 32.0-32.9, adult: Secondary | ICD-10-CM | POA: Diagnosis not present

## 2021-12-24 DIAGNOSIS — E1165 Type 2 diabetes mellitus with hyperglycemia: Secondary | ICD-10-CM | POA: Diagnosis not present

## 2021-12-24 DIAGNOSIS — I10 Essential (primary) hypertension: Secondary | ICD-10-CM | POA: Diagnosis not present

## 2022-01-21 DIAGNOSIS — Z6831 Body mass index (BMI) 31.0-31.9, adult: Secondary | ICD-10-CM | POA: Diagnosis not present

## 2022-01-21 DIAGNOSIS — N390 Urinary tract infection, site not specified: Secondary | ICD-10-CM | POA: Diagnosis not present

## 2022-03-15 DIAGNOSIS — E1165 Type 2 diabetes mellitus with hyperglycemia: Secondary | ICD-10-CM | POA: Diagnosis not present

## 2022-03-20 DIAGNOSIS — E1169 Type 2 diabetes mellitus with other specified complication: Secondary | ICD-10-CM | POA: Diagnosis not present

## 2022-03-20 DIAGNOSIS — E291 Testicular hypofunction: Secondary | ICD-10-CM | POA: Diagnosis not present

## 2022-03-20 DIAGNOSIS — E782 Mixed hyperlipidemia: Secondary | ICD-10-CM | POA: Diagnosis not present

## 2022-03-20 DIAGNOSIS — Z6832 Body mass index (BMI) 32.0-32.9, adult: Secondary | ICD-10-CM | POA: Diagnosis not present

## 2022-03-25 DIAGNOSIS — N401 Enlarged prostate with lower urinary tract symptoms: Secondary | ICD-10-CM | POA: Diagnosis not present

## 2022-03-25 DIAGNOSIS — Z79899 Other long term (current) drug therapy: Secondary | ICD-10-CM | POA: Diagnosis not present

## 2022-03-25 DIAGNOSIS — E291 Testicular hypofunction: Secondary | ICD-10-CM | POA: Diagnosis not present

## 2022-03-25 DIAGNOSIS — N529 Male erectile dysfunction, unspecified: Secondary | ICD-10-CM | POA: Diagnosis not present

## 2022-03-25 DIAGNOSIS — N3090 Cystitis, unspecified without hematuria: Secondary | ICD-10-CM | POA: Diagnosis not present

## 2022-03-30 ENCOUNTER — Encounter: Payer: Self-pay | Admitting: Cardiology

## 2022-04-17 DIAGNOSIS — J069 Acute upper respiratory infection, unspecified: Secondary | ICD-10-CM | POA: Diagnosis not present

## 2022-04-17 DIAGNOSIS — N3090 Cystitis, unspecified without hematuria: Secondary | ICD-10-CM | POA: Diagnosis not present

## 2022-04-17 DIAGNOSIS — R300 Dysuria: Secondary | ICD-10-CM | POA: Diagnosis not present

## 2022-05-14 ENCOUNTER — Other Ambulatory Visit: Payer: Self-pay

## 2022-05-17 ENCOUNTER — Encounter: Payer: Self-pay | Admitting: Cardiology

## 2022-05-17 ENCOUNTER — Ambulatory Visit: Payer: BC Managed Care – PPO | Attending: Cardiology | Admitting: Cardiology

## 2022-05-17 VITALS — BP 112/66 | HR 81 | Ht 73.0 in | Wt 236.0 lb

## 2022-05-17 DIAGNOSIS — I10 Essential (primary) hypertension: Secondary | ICD-10-CM

## 2022-05-17 DIAGNOSIS — E782 Mixed hyperlipidemia: Secondary | ICD-10-CM | POA: Diagnosis not present

## 2022-05-17 DIAGNOSIS — E669 Obesity, unspecified: Secondary | ICD-10-CM | POA: Diagnosis not present

## 2022-05-17 DIAGNOSIS — I2510 Atherosclerotic heart disease of native coronary artery without angina pectoris: Secondary | ICD-10-CM

## 2022-05-17 DIAGNOSIS — E66811 Obesity, class 1: Secondary | ICD-10-CM

## 2022-05-17 NOTE — Progress Notes (Signed)
Cardiology Office Note:    Date:  05/17/2022   ID:  [name], DOB Sep 14, 1965, MRN [id number]  PCP:  Mateo Flow, MD  Cardiologist:  Jenean Lindau, MD   Referring MD: Mateo Flow, MD    ASSESSMENT:    1. Atherosclerosis of native coronary artery of native heart without angina pectoris   2. Essential hypertension   3. Obesity (BMI 30.0-34.9)   4. Mixed hypercholesterolemia and hypertriglyceridemia    PLAN:    In order of problems listed above:  Coronary artery disease: Secondary prevention stressed with the patient.  Importance of compliance with diet medication stressed any vocalized understanding.  He was advised to walk at least half an hour a day 5 days a week and he promises to do so. Essential hypertension: Blood pressure is stable and diet was emphasized. Mixed dyslipidemia: On lipid-lowering medications followed by primary care. Patient does not have nitroglycerin with him because he uses Cialis on a daily basis.  He understands benefits risks. Obesity: Weight reduction stressed.  He has done an excellent job with diet and has lost significant weight.  I congratulated him. Patient will be seen in follow-up appointment in 9 months or earlier if the patient has any concerns    Medication Adjustments/Labs and Tests Ordered: Current medicines are reviewed at length with the patient today.  Concerns regarding medicines are outlined above.  No orders of the defined types were placed in this encounter.  No orders of the defined types were placed in this encounter.    No chief complaint on file.    History of Present Illness:    Ethan Chapman is a 57 y.o. male.  Patient has past medical history of coronary artery disease, essential hypertension, mixed dyslipidemia and obesity.  He denies any problems at this time and takes care of activities of daily living.  No chest pain orthopnea or PND.  At the time of my evaluation, the patient is alert awake oriented  and in no distress.  He is an active gentleman but does not exercise on a regular basis.  Past Medical History:  Diagnosis Date   Abnormal nuclear stress test 08/24/2019   Coronary atherosclerosis 06/18/2019   Essential hypertension 06/01/2020   Headache 08/03/2020   Mixed hypercholesterolemia and hypertriglyceridemia    Obesity (BMI 30.0-34.9) 06/01/2020   Recurrent UTI     Past Surgical History:  Procedure Laterality Date   HYPOSPADIAS CORRECTION     LUMBAR [place] SURGERY  2005   L4-5 left sded microdiscectomy    Current Medications: Current Meds  Medication Sig   amLODipine (NORVASC) 5 MG tablet Take 5 mg by mouth daily.   Cinnamon 500 MG capsule Take 500 mg by mouth daily.   losartan-hydrochlorothiazide (HYZAAR) 50-12.5 MG tablet Take 1 tablet by mouth daily.   metFORMIN (GLUCOPHAGE-XR) 750 MG 24 hr tablet Take 1,500 mg by mouth daily.   MOUNJARO 2.5 MG/0.5ML Pen Inject 2.5 mg into the skin once a week.   nitrofurantoin, macrocrystal-monohydrate, (MACROBID) 100 MG capsule Take 100 mg by mouth daily.   omega-3 acid ethyl esters (LOVAZA) 1 g capsule Take 2 capsules by mouth 2 (two) times daily.   omeprazole (PRILOSEC) 40 MG capsule Take 40 mg by mouth daily.   rosuvastatin (CRESTOR) 10 MG tablet Take 10 mg by mouth daily.   silodosin (RAPAFLO) 8 MG CAPS capsule Take 8 mg by mouth daily.   tadalafil (CIALIS) 5 MG tablet Take 5 mg by mouth daily.  testosterone cypionate (DEPOTESTOSTERONE CYPIONATE) 200 MG/ML injection Inject 0.6 mLs into the muscle as directed. Every 10 days     Allergies:   Naproxen   Social History   Socioeconomic History   Marital status: Married    Spouse name: Not on file   Number of children: Not on file   Years of education: Not on file   Highest education level: Not on file  Occupational History   Not on file  Tobacco Use   Smoking status: Never   Smokeless tobacco: Former    Quit date: 06/18/2003  Vaping Use   Vaping Use: Never used  Substance  and Sexual Activity   Alcohol use: Yes    Comment: Rare, special occasions   Drug use: Never   Sexual activity: Not on file  Other Topics Concern   Not on file  Social History Narrative   Not on file   Social Determinants of Health   Financial Resource Strain: Not on file  Food Insecurity: Not on file  Transportation Needs: Not on file  Physical Activity: Not on file  Stress: Not on file  Social Connections: Not on file     Family History: The patient's family history includes Dementia in his mother; Hypertension in his father.  ROS:   Please see the history of present illness.    All other systems reviewed and are negative.  EKGs/Labs/Other Studies Reviewed:    The following studies were reviewed today: EKG reveals sinus rhythm and nonspecific ST-T changes   Recent Labs: No results found for requested labs within last 365 days.  Recent Lipid Panel    Component Value Date/Time   CHOL 143 06/06/2020 0853   TRIG 139 06/06/2020 0853   HDL 35 (L) 06/06/2020 0853   CHOLHDL 4.1 06/06/2020 0853   LDLCALC 83 06/06/2020 0853    Physical Exam:    VS:  BP 112/66   Pulse 81   Ht 6\' 1"  (1.854 m)   Wt 236 lb (107 kg)   SpO2 96%   BMI 31.14 kg/m     Wt Readings from Last 3 Encounters:  05/17/22 236 lb (107 kg)  05/09/21 257 lb (116.6 kg)  10/02/20 257 lb 3.2 oz (116.7 kg)     GEN: Patient is in no acute distress HEENT: Normal NECK: No JVD; No carotid bruits LYMPHATICS: No lymphadenopathy CARDIAC: Hear sounds regular, 2/6 systolic murmur at the apex. RESPIRATORY:  Clear to auscultation without rales, wheezing or rhonchi  ABDOMEN: Soft, non-tender, non-distended MUSCULOSKELETAL:  No edema; No deformity  SKIN: Warm and dry NEUROLOGIC:  Alert and oriented x 3 PSYCHIATRIC:  Normal affect   Signed, Jenean Lindau, MD  05/17/2022 11:55 AM    [place]

## 2022-05-17 NOTE — Patient Instructions (Signed)

## 2022-06-03 ENCOUNTER — Other Ambulatory Visit: Payer: Self-pay

## 2022-06-03 MED ORDER — LOSARTAN POTASSIUM-HCTZ 50-12.5 MG PO TABS: 1.0000 | ORAL_TABLET | Freq: Every day | ORAL | 3 refills | Status: DC

## 2022-06-03 MED ORDER — ROSUVASTATIN CALCIUM 10 MG PO TABS: 10.0000 mg | ORAL_TABLET | Freq: Every day | ORAL | 3 refills | Status: DC

## 2022-06-05 DIAGNOSIS — J069 Acute upper respiratory infection, unspecified: Secondary | ICD-10-CM | POA: Diagnosis not present

## 2022-09-20 DIAGNOSIS — M79605 Pain in left leg: Secondary | ICD-10-CM | POA: Diagnosis not present

## 2022-10-08 DIAGNOSIS — N3091 Cystitis, unspecified with hematuria: Secondary | ICD-10-CM | POA: Diagnosis not present

## 2022-10-09 DIAGNOSIS — N401 Enlarged prostate with lower urinary tract symptoms: Secondary | ICD-10-CM | POA: Diagnosis not present

## 2022-10-09 DIAGNOSIS — E291 Testicular hypofunction: Secondary | ICD-10-CM | POA: Diagnosis not present

## 2022-10-09 DIAGNOSIS — N3090 Cystitis, unspecified without hematuria: Secondary | ICD-10-CM | POA: Diagnosis not present

## 2022-10-09 DIAGNOSIS — Z79899 Other long term (current) drug therapy: Secondary | ICD-10-CM | POA: Diagnosis not present

## 2022-10-09 DIAGNOSIS — N529 Male erectile dysfunction, unspecified: Secondary | ICD-10-CM | POA: Diagnosis not present

## 2022-10-14 DIAGNOSIS — S83242A Other tear of medial meniscus, current injury, left knee, initial encounter: Secondary | ICD-10-CM | POA: Diagnosis not present

## 2022-10-14 DIAGNOSIS — M1712 Unilateral primary osteoarthritis, left knee: Secondary | ICD-10-CM | POA: Diagnosis not present

## 2022-10-15 DIAGNOSIS — M5432 Sciatica, left side: Secondary | ICD-10-CM | POA: Diagnosis not present

## 2022-10-15 DIAGNOSIS — E1169 Type 2 diabetes mellitus with other specified complication: Secondary | ICD-10-CM | POA: Diagnosis not present

## 2022-10-21 DIAGNOSIS — M545 Low back pain, unspecified: Secondary | ICD-10-CM | POA: Diagnosis not present

## 2022-10-21 DIAGNOSIS — M5432 Sciatica, left side: Secondary | ICD-10-CM | POA: Diagnosis not present

## 2022-10-21 DIAGNOSIS — M5136 Other intervertebral disc degeneration, lumbar region: Secondary | ICD-10-CM | POA: Diagnosis not present

## 2022-10-21 DIAGNOSIS — S83232A Complex tear of medial meniscus, current injury, left knee, initial encounter: Secondary | ICD-10-CM | POA: Diagnosis not present

## 2022-10-21 DIAGNOSIS — M4187 Other forms of scoliosis, lumbosacral region: Secondary | ICD-10-CM | POA: Diagnosis not present

## 2022-10-29 DIAGNOSIS — M179 Osteoarthritis of knee, unspecified: Secondary | ICD-10-CM | POA: Diagnosis not present

## 2022-10-29 DIAGNOSIS — S83249A Other tear of medial meniscus, current injury, unspecified knee, initial encounter: Secondary | ICD-10-CM | POA: Diagnosis not present

## 2022-11-14 DIAGNOSIS — Z7982 Long term (current) use of aspirin: Secondary | ICD-10-CM | POA: Diagnosis not present

## 2022-11-14 DIAGNOSIS — M23362 Other meniscus derangements, other lateral meniscus, left knee: Secondary | ICD-10-CM | POA: Diagnosis not present

## 2022-11-14 DIAGNOSIS — M23252 Derangement of posterior horn of lateral meniscus due to old tear or injury, left knee: Secondary | ICD-10-CM | POA: Diagnosis not present

## 2022-11-14 DIAGNOSIS — I10 Essential (primary) hypertension: Secondary | ICD-10-CM | POA: Diagnosis not present

## 2022-11-14 DIAGNOSIS — Z7985 Long-term (current) use of injectable non-insulin antidiabetic drugs: Secondary | ICD-10-CM | POA: Diagnosis not present

## 2022-11-14 DIAGNOSIS — Z7984 Long term (current) use of oral hypoglycemic drugs: Secondary | ICD-10-CM | POA: Diagnosis not present

## 2022-11-14 DIAGNOSIS — M1712 Unilateral primary osteoarthritis, left knee: Secondary | ICD-10-CM | POA: Diagnosis not present

## 2022-11-14 DIAGNOSIS — K219 Gastro-esophageal reflux disease without esophagitis: Secondary | ICD-10-CM | POA: Diagnosis not present

## 2022-11-14 DIAGNOSIS — S83282A Other tear of lateral meniscus, current injury, left knee, initial encounter: Secondary | ICD-10-CM | POA: Diagnosis not present

## 2022-11-14 DIAGNOSIS — M23332 Other meniscus derangements, other medial meniscus, left knee: Secondary | ICD-10-CM | POA: Diagnosis not present

## 2022-11-14 DIAGNOSIS — M23232 Derangement of other medial meniscus due to old tear or injury, left knee: Secondary | ICD-10-CM | POA: Diagnosis not present

## 2022-11-14 DIAGNOSIS — E119 Type 2 diabetes mellitus without complications: Secondary | ICD-10-CM | POA: Diagnosis not present

## 2022-11-14 DIAGNOSIS — S83242A Other tear of medial meniscus, current injury, left knee, initial encounter: Secondary | ICD-10-CM | POA: Diagnosis not present

## 2022-11-18 DIAGNOSIS — R2689 Other abnormalities of gait and mobility: Secondary | ICD-10-CM | POA: Diagnosis not present

## 2022-11-18 DIAGNOSIS — M25462 Effusion, left knee: Secondary | ICD-10-CM | POA: Diagnosis not present

## 2022-11-18 DIAGNOSIS — M25562 Pain in left knee: Secondary | ICD-10-CM | POA: Diagnosis not present

## 2022-11-18 DIAGNOSIS — M25662 Stiffness of left knee, not elsewhere classified: Secondary | ICD-10-CM | POA: Diagnosis not present

## 2022-11-25 DIAGNOSIS — M25562 Pain in left knee: Secondary | ICD-10-CM | POA: Diagnosis not present

## 2022-11-25 DIAGNOSIS — M25462 Effusion, left knee: Secondary | ICD-10-CM | POA: Diagnosis not present

## 2022-11-25 DIAGNOSIS — R2689 Other abnormalities of gait and mobility: Secondary | ICD-10-CM | POA: Diagnosis not present

## 2022-11-25 DIAGNOSIS — M25662 Stiffness of left knee, not elsewhere classified: Secondary | ICD-10-CM | POA: Diagnosis not present

## 2022-11-27 DIAGNOSIS — M25462 Effusion, left knee: Secondary | ICD-10-CM | POA: Diagnosis not present

## 2022-11-27 DIAGNOSIS — M25662 Stiffness of left knee, not elsewhere classified: Secondary | ICD-10-CM | POA: Diagnosis not present

## 2022-11-27 DIAGNOSIS — M25562 Pain in left knee: Secondary | ICD-10-CM | POA: Diagnosis not present

## 2022-11-27 DIAGNOSIS — R2689 Other abnormalities of gait and mobility: Secondary | ICD-10-CM | POA: Diagnosis not present

## 2022-12-02 DIAGNOSIS — M25662 Stiffness of left knee, not elsewhere classified: Secondary | ICD-10-CM | POA: Diagnosis not present

## 2022-12-02 DIAGNOSIS — M25562 Pain in left knee: Secondary | ICD-10-CM | POA: Diagnosis not present

## 2022-12-02 DIAGNOSIS — M25462 Effusion, left knee: Secondary | ICD-10-CM | POA: Diagnosis not present

## 2022-12-02 DIAGNOSIS — R2689 Other abnormalities of gait and mobility: Secondary | ICD-10-CM | POA: Diagnosis not present

## 2022-12-05 DIAGNOSIS — R2689 Other abnormalities of gait and mobility: Secondary | ICD-10-CM | POA: Diagnosis not present

## 2022-12-05 DIAGNOSIS — M25462 Effusion, left knee: Secondary | ICD-10-CM | POA: Diagnosis not present

## 2022-12-05 DIAGNOSIS — M25662 Stiffness of left knee, not elsewhere classified: Secondary | ICD-10-CM | POA: Diagnosis not present

## 2022-12-05 DIAGNOSIS — M25562 Pain in left knee: Secondary | ICD-10-CM | POA: Diagnosis not present

## 2022-12-09 DIAGNOSIS — M25662 Stiffness of left knee, not elsewhere classified: Secondary | ICD-10-CM | POA: Diagnosis not present

## 2022-12-09 DIAGNOSIS — M25562 Pain in left knee: Secondary | ICD-10-CM | POA: Diagnosis not present

## 2022-12-09 DIAGNOSIS — R2689 Other abnormalities of gait and mobility: Secondary | ICD-10-CM | POA: Diagnosis not present

## 2022-12-09 DIAGNOSIS — M25462 Effusion, left knee: Secondary | ICD-10-CM | POA: Diagnosis not present

## 2022-12-12 DIAGNOSIS — R2689 Other abnormalities of gait and mobility: Secondary | ICD-10-CM | POA: Diagnosis not present

## 2022-12-12 DIAGNOSIS — M25462 Effusion, left knee: Secondary | ICD-10-CM | POA: Diagnosis not present

## 2022-12-12 DIAGNOSIS — M25562 Pain in left knee: Secondary | ICD-10-CM | POA: Diagnosis not present

## 2022-12-12 DIAGNOSIS — M25662 Stiffness of left knee, not elsewhere classified: Secondary | ICD-10-CM | POA: Diagnosis not present

## 2022-12-18 DIAGNOSIS — M25462 Effusion, left knee: Secondary | ICD-10-CM | POA: Diagnosis not present

## 2022-12-18 DIAGNOSIS — R2689 Other abnormalities of gait and mobility: Secondary | ICD-10-CM | POA: Diagnosis not present

## 2022-12-18 DIAGNOSIS — M25662 Stiffness of left knee, not elsewhere classified: Secondary | ICD-10-CM | POA: Diagnosis not present

## 2022-12-18 DIAGNOSIS — M25562 Pain in left knee: Secondary | ICD-10-CM | POA: Diagnosis not present

## 2022-12-20 DIAGNOSIS — M25562 Pain in left knee: Secondary | ICD-10-CM | POA: Diagnosis not present

## 2022-12-20 DIAGNOSIS — R2689 Other abnormalities of gait and mobility: Secondary | ICD-10-CM | POA: Diagnosis not present

## 2022-12-20 DIAGNOSIS — M25662 Stiffness of left knee, not elsewhere classified: Secondary | ICD-10-CM | POA: Diagnosis not present

## 2022-12-20 DIAGNOSIS — M25462 Effusion, left knee: Secondary | ICD-10-CM | POA: Diagnosis not present

## 2022-12-25 DIAGNOSIS — M25662 Stiffness of left knee, not elsewhere classified: Secondary | ICD-10-CM | POA: Diagnosis not present

## 2022-12-25 DIAGNOSIS — M25462 Effusion, left knee: Secondary | ICD-10-CM | POA: Diagnosis not present

## 2022-12-25 DIAGNOSIS — M25562 Pain in left knee: Secondary | ICD-10-CM | POA: Diagnosis not present

## 2022-12-25 DIAGNOSIS — R2689 Other abnormalities of gait and mobility: Secondary | ICD-10-CM | POA: Diagnosis not present

## 2022-12-27 DIAGNOSIS — M25462 Effusion, left knee: Secondary | ICD-10-CM | POA: Diagnosis not present

## 2022-12-27 DIAGNOSIS — M25562 Pain in left knee: Secondary | ICD-10-CM | POA: Diagnosis not present

## 2022-12-27 DIAGNOSIS — M25662 Stiffness of left knee, not elsewhere classified: Secondary | ICD-10-CM | POA: Diagnosis not present

## 2022-12-27 DIAGNOSIS — R2689 Other abnormalities of gait and mobility: Secondary | ICD-10-CM | POA: Diagnosis not present

## 2022-12-30 DIAGNOSIS — M25662 Stiffness of left knee, not elsewhere classified: Secondary | ICD-10-CM | POA: Diagnosis not present

## 2022-12-30 DIAGNOSIS — M25462 Effusion, left knee: Secondary | ICD-10-CM | POA: Diagnosis not present

## 2022-12-30 DIAGNOSIS — M25562 Pain in left knee: Secondary | ICD-10-CM | POA: Diagnosis not present

## 2022-12-30 DIAGNOSIS — R2689 Other abnormalities of gait and mobility: Secondary | ICD-10-CM | POA: Diagnosis not present

## 2023-01-01 DIAGNOSIS — M25562 Pain in left knee: Secondary | ICD-10-CM | POA: Diagnosis not present

## 2023-01-01 DIAGNOSIS — R2689 Other abnormalities of gait and mobility: Secondary | ICD-10-CM | POA: Diagnosis not present

## 2023-01-01 DIAGNOSIS — M25462 Effusion, left knee: Secondary | ICD-10-CM | POA: Diagnosis not present

## 2023-01-01 DIAGNOSIS — M25662 Stiffness of left knee, not elsewhere classified: Secondary | ICD-10-CM | POA: Diagnosis not present

## 2023-01-06 DIAGNOSIS — R2689 Other abnormalities of gait and mobility: Secondary | ICD-10-CM | POA: Diagnosis not present

## 2023-01-06 DIAGNOSIS — M25462 Effusion, left knee: Secondary | ICD-10-CM | POA: Diagnosis not present

## 2023-01-06 DIAGNOSIS — M25662 Stiffness of left knee, not elsewhere classified: Secondary | ICD-10-CM | POA: Diagnosis not present

## 2023-01-06 DIAGNOSIS — M25562 Pain in left knee: Secondary | ICD-10-CM | POA: Diagnosis not present

## 2023-01-08 DIAGNOSIS — M25562 Pain in left knee: Secondary | ICD-10-CM | POA: Diagnosis not present

## 2023-01-08 DIAGNOSIS — R2689 Other abnormalities of gait and mobility: Secondary | ICD-10-CM | POA: Diagnosis not present

## 2023-01-08 DIAGNOSIS — M25462 Effusion, left knee: Secondary | ICD-10-CM | POA: Diagnosis not present

## 2023-01-08 DIAGNOSIS — M25662 Stiffness of left knee, not elsewhere classified: Secondary | ICD-10-CM | POA: Diagnosis not present

## 2023-01-15 DIAGNOSIS — M25562 Pain in left knee: Secondary | ICD-10-CM | POA: Diagnosis not present

## 2023-01-15 DIAGNOSIS — M25662 Stiffness of left knee, not elsewhere classified: Secondary | ICD-10-CM | POA: Diagnosis not present

## 2023-01-15 DIAGNOSIS — M25462 Effusion, left knee: Secondary | ICD-10-CM | POA: Diagnosis not present

## 2023-01-15 DIAGNOSIS — R2689 Other abnormalities of gait and mobility: Secondary | ICD-10-CM | POA: Diagnosis not present

## 2023-01-22 DIAGNOSIS — M25662 Stiffness of left knee, not elsewhere classified: Secondary | ICD-10-CM | POA: Diagnosis not present

## 2023-01-22 DIAGNOSIS — R2689 Other abnormalities of gait and mobility: Secondary | ICD-10-CM | POA: Diagnosis not present

## 2023-01-22 DIAGNOSIS — M25562 Pain in left knee: Secondary | ICD-10-CM | POA: Diagnosis not present

## 2023-01-22 DIAGNOSIS — M25462 Effusion, left knee: Secondary | ICD-10-CM | POA: Diagnosis not present

## 2023-02-05 DIAGNOSIS — M25662 Stiffness of left knee, not elsewhere classified: Secondary | ICD-10-CM | POA: Diagnosis not present

## 2023-02-05 DIAGNOSIS — M25462 Effusion, left knee: Secondary | ICD-10-CM | POA: Diagnosis not present

## 2023-02-05 DIAGNOSIS — R2689 Other abnormalities of gait and mobility: Secondary | ICD-10-CM | POA: Diagnosis not present

## 2023-02-05 DIAGNOSIS — M25562 Pain in left knee: Secondary | ICD-10-CM | POA: Diagnosis not present

## 2023-04-28 DIAGNOSIS — R3913 Splitting of urinary stream: Secondary | ICD-10-CM | POA: Diagnosis not present

## 2023-04-28 DIAGNOSIS — N3020 Other chronic cystitis without hematuria: Secondary | ICD-10-CM | POA: Diagnosis not present

## 2023-06-05 DIAGNOSIS — S6992XA Unspecified injury of left wrist, hand and finger(s), initial encounter: Secondary | ICD-10-CM | POA: Diagnosis not present

## 2023-06-13 ENCOUNTER — Other Ambulatory Visit: Payer: Self-pay

## 2023-06-13 MED ORDER — LOSARTAN POTASSIUM-HCTZ 50-12.5 MG PO TABS: 1.0000 | ORAL_TABLET | Freq: Every day | ORAL | 0 refills | Status: DC

## 2023-06-13 MED ORDER — ROSUVASTATIN CALCIUM 10 MG PO TABS: 10.0000 mg | ORAL_TABLET | Freq: Every day | ORAL | 0 refills | Status: DC

## 2023-06-13 NOTE — Addendum Note (Signed)
 Addended by: Margaret Pyle D on: 06/13/2023 01:21 PM   Modules accepted: Orders

## 2023-07-10 ENCOUNTER — Other Ambulatory Visit: Payer: Self-pay | Admitting: Cardiology

## 2023-07-10 DIAGNOSIS — S6992XA Unspecified injury of left wrist, hand and finger(s), initial encounter: Secondary | ICD-10-CM | POA: Diagnosis not present

## 2023-07-21 DIAGNOSIS — Z125 Encounter for screening for malignant neoplasm of prostate: Secondary | ICD-10-CM | POA: Diagnosis not present

## 2023-07-23 DIAGNOSIS — M79642 Pain in left hand: Secondary | ICD-10-CM | POA: Diagnosis not present

## 2023-07-23 DIAGNOSIS — M25442 Effusion, left hand: Secondary | ICD-10-CM | POA: Diagnosis not present

## 2023-07-23 DIAGNOSIS — M25642 Stiffness of left hand, not elsewhere classified: Secondary | ICD-10-CM | POA: Diagnosis not present

## 2023-07-23 DIAGNOSIS — M62542 Muscle wasting and atrophy, not elsewhere classified, left hand: Secondary | ICD-10-CM | POA: Diagnosis not present

## 2023-07-25 DIAGNOSIS — M79642 Pain in left hand: Secondary | ICD-10-CM | POA: Diagnosis not present

## 2023-07-25 DIAGNOSIS — M62542 Muscle wasting and atrophy, not elsewhere classified, left hand: Secondary | ICD-10-CM | POA: Diagnosis not present

## 2023-07-25 DIAGNOSIS — M25442 Effusion, left hand: Secondary | ICD-10-CM | POA: Diagnosis not present

## 2023-07-25 DIAGNOSIS — M25642 Stiffness of left hand, not elsewhere classified: Secondary | ICD-10-CM | POA: Diagnosis not present

## 2023-07-28 DIAGNOSIS — R059 Cough, unspecified: Secondary | ICD-10-CM | POA: Diagnosis not present

## 2023-07-30 DIAGNOSIS — M62542 Muscle wasting and atrophy, not elsewhere classified, left hand: Secondary | ICD-10-CM | POA: Diagnosis not present

## 2023-07-30 DIAGNOSIS — M79642 Pain in left hand: Secondary | ICD-10-CM | POA: Diagnosis not present

## 2023-07-30 DIAGNOSIS — M25442 Effusion, left hand: Secondary | ICD-10-CM | POA: Diagnosis not present

## 2023-07-30 DIAGNOSIS — M25642 Stiffness of left hand, not elsewhere classified: Secondary | ICD-10-CM | POA: Diagnosis not present

## 2023-08-04 DIAGNOSIS — R3913 Splitting of urinary stream: Secondary | ICD-10-CM | POA: Diagnosis not present

## 2023-08-04 DIAGNOSIS — R3912 Poor urinary stream: Secondary | ICD-10-CM | POA: Diagnosis not present

## 2023-08-05 DIAGNOSIS — M25642 Stiffness of left hand, not elsewhere classified: Secondary | ICD-10-CM | POA: Diagnosis not present

## 2023-08-05 DIAGNOSIS — M62542 Muscle wasting and atrophy, not elsewhere classified, left hand: Secondary | ICD-10-CM | POA: Diagnosis not present

## 2023-08-05 DIAGNOSIS — M25442 Effusion, left hand: Secondary | ICD-10-CM | POA: Diagnosis not present

## 2023-08-05 DIAGNOSIS — M79642 Pain in left hand: Secondary | ICD-10-CM | POA: Diagnosis not present

## 2023-08-07 ENCOUNTER — Other Ambulatory Visit: Payer: Self-pay | Admitting: Urology

## 2023-08-07 DIAGNOSIS — R509 Fever, unspecified: Secondary | ICD-10-CM | POA: Diagnosis not present

## 2023-08-07 DIAGNOSIS — J988 Other specified respiratory disorders: Secondary | ICD-10-CM | POA: Diagnosis not present

## 2023-08-07 DIAGNOSIS — Z6831 Body mass index (BMI) 31.0-31.9, adult: Secondary | ICD-10-CM | POA: Diagnosis not present

## 2023-08-07 DIAGNOSIS — B9789 Other viral agents as the cause of diseases classified elsewhere: Secondary | ICD-10-CM | POA: Diagnosis not present

## 2023-08-12 DIAGNOSIS — M25642 Stiffness of left hand, not elsewhere classified: Secondary | ICD-10-CM | POA: Diagnosis not present

## 2023-08-12 DIAGNOSIS — M25442 Effusion, left hand: Secondary | ICD-10-CM | POA: Diagnosis not present

## 2023-08-12 DIAGNOSIS — M79642 Pain in left hand: Secondary | ICD-10-CM | POA: Diagnosis not present

## 2023-08-12 DIAGNOSIS — M62542 Muscle wasting and atrophy, not elsewhere classified, left hand: Secondary | ICD-10-CM | POA: Diagnosis not present

## 2023-08-13 DIAGNOSIS — E291 Testicular hypofunction: Secondary | ICD-10-CM | POA: Diagnosis not present

## 2023-08-13 DIAGNOSIS — Z125 Encounter for screening for malignant neoplasm of prostate: Secondary | ICD-10-CM | POA: Diagnosis not present

## 2023-08-13 DIAGNOSIS — E782 Mixed hyperlipidemia: Secondary | ICD-10-CM | POA: Diagnosis not present

## 2023-08-13 DIAGNOSIS — E1169 Type 2 diabetes mellitus with other specified complication: Secondary | ICD-10-CM | POA: Diagnosis not present

## 2023-08-14 DIAGNOSIS — M25442 Effusion, left hand: Secondary | ICD-10-CM | POA: Diagnosis not present

## 2023-08-14 DIAGNOSIS — M62542 Muscle wasting and atrophy, not elsewhere classified, left hand: Secondary | ICD-10-CM | POA: Diagnosis not present

## 2023-08-14 DIAGNOSIS — M25642 Stiffness of left hand, not elsewhere classified: Secondary | ICD-10-CM | POA: Diagnosis not present

## 2023-08-14 DIAGNOSIS — M79642 Pain in left hand: Secondary | ICD-10-CM | POA: Diagnosis not present

## 2023-08-18 DIAGNOSIS — E782 Mixed hyperlipidemia: Secondary | ICD-10-CM | POA: Diagnosis not present

## 2023-08-18 DIAGNOSIS — I10 Essential (primary) hypertension: Secondary | ICD-10-CM | POA: Diagnosis not present

## 2023-08-18 DIAGNOSIS — E1169 Type 2 diabetes mellitus with other specified complication: Secondary | ICD-10-CM | POA: Diagnosis not present

## 2023-08-18 DIAGNOSIS — E291 Testicular hypofunction: Secondary | ICD-10-CM | POA: Diagnosis not present

## 2023-08-19 DIAGNOSIS — M62542 Muscle wasting and atrophy, not elsewhere classified, left hand: Secondary | ICD-10-CM | POA: Diagnosis not present

## 2023-08-19 DIAGNOSIS — M25442 Effusion, left hand: Secondary | ICD-10-CM | POA: Diagnosis not present

## 2023-08-19 DIAGNOSIS — M79642 Pain in left hand: Secondary | ICD-10-CM | POA: Diagnosis not present

## 2023-08-19 DIAGNOSIS — M25642 Stiffness of left hand, not elsewhere classified: Secondary | ICD-10-CM | POA: Diagnosis not present

## 2023-08-20 DIAGNOSIS — S6992XA Unspecified injury of left wrist, hand and finger(s), initial encounter: Secondary | ICD-10-CM | POA: Diagnosis not present

## 2023-08-21 DIAGNOSIS — M79642 Pain in left hand: Secondary | ICD-10-CM | POA: Diagnosis not present

## 2023-08-21 DIAGNOSIS — M25642 Stiffness of left hand, not elsewhere classified: Secondary | ICD-10-CM | POA: Diagnosis not present

## 2023-08-21 DIAGNOSIS — M25442 Effusion, left hand: Secondary | ICD-10-CM | POA: Diagnosis not present

## 2023-08-21 DIAGNOSIS — M62542 Muscle wasting and atrophy, not elsewhere classified, left hand: Secondary | ICD-10-CM | POA: Diagnosis not present

## 2023-08-26 ENCOUNTER — Other Ambulatory Visit: Payer: Self-pay | Admitting: Cardiology

## 2023-09-22 ENCOUNTER — Other Ambulatory Visit: Payer: Self-pay | Admitting: Cardiology

## 2023-10-13 ENCOUNTER — Other Ambulatory Visit: Payer: Self-pay | Admitting: Cardiology

## 2023-11-08 NOTE — Progress Notes (Signed)
 COVID Vaccine received:  []  No [x]  Yes Date of any COVID positive Test in last 90 days: none  PCP - Tracey Bathe, MD  Cardiologist - Ave Crape, MD) new pt w/ Dr. Bernie 12-24-2023 in same office  Chest x-ray -  EKG -  2022   will repeat  Stress Test - 08-18-2019 ECHO - 07-26-2019  Epic Cardiac Cath -  CT Coronary Calcium  score: 690 on 09-27-2019  Epic  Bowel Prep - [x]  No  []   Yes ______  Pacemaker / ICD device [x]  No []  Yes   Spinal Cord Stimulator:[x]  No []  Yes       History of Sleep Apnea? [x]  No []  Yes   CPAP used?- [x]  No []  Yes    Does the patient monitor blood sugar?   []  N/A   []  No []  Yes  Patient has: []  NO Hx DM   []  Pre-DM   []  DM1  [x]   DM2 Last A1c was:  7.2  on 03-15-2022   per pt 5.1 at Dr. Kathern 09-2023      Does patient have a Freestyle Libre or Dexcom? [x]  No []  Yes    MOUNJARO-  takes Wednesday  Last dose: 11-12-23 METFORMIN- hold DOS  Blood Thinner / Instructions: none Aspirin Instructions:  ASA 81 mg   hold x 1 week  ERAS Protocol Ordered: [x]  No  []  Yes Patient is to be NPO after: MN Prior  Dental hx: []  Dentures:  [x]  N/A      []  Bridge or Partial:                   []  Loose or Damaged teeth:    Activity level: Able to walk up 2 flights of stairs without becoming significantly short of breath or having chest pain?  []  No   [x]    Yes   Anesthesia review: GERD, HTN, CAD, Murmur 2/6 systolic at Apex per Dr. Posey note 05-17-22, DM2,   Patient denies any S&S of respiratory illness or Covid - no shortness of breath, fever, cough or chest pain at PAT appointment.  Patient verbalized understanding and agreement to the Pre-Surgical Instructions that were given to them at this PAT appointment. Patient was also educated of the need to review these PAT instructions again prior to his surgery.I reviewed the appropriate phone numbers to call if they have any and questions or concerns.

## 2023-11-08 NOTE — Patient Instructions (Signed)
 SURGICAL WAITING ROOM VISITATION Patients having surgery or a procedure may have no more than 2 support people in the waiting area - these visitors may rotate in the visitor waiting room.   If the patient needs to stay at the hospital during part of their recovery, the visitor guidelines for inpatient rooms apply.  PRE-OP VISITATION  Pre-op nurse will coordinate an appropriate time for 1 support person to accompany the patient in pre-op.  This support person may not rotate.  This visitor will be contacted when the time is appropriate for the visitor to come back in the pre-op area.  Please refer to the [facility] website for the visitor guidelines for Inpatients (after your surgery is over and you are in a regular room).  You are not required to quarantine at this time prior to your surgery. However, you must do this: Hand Hygiene often Do NOT share personal items Notify your provider if you are in close contact with someone who has COVID or you develop fever 100.4 or greater, new onset of sneezing, cough, sore throat, shortness of breath or body aches.  If you test positive for Covid or have been in contact with anyone that has tested positive in the last 10 days please notify you surgeon.    Your procedure is scheduled on:  Wednesday  November 26, 2023  Report to [facility] Main Entrance: [place] entrance where the [employer] is available.   Report to admitting at:  06:15   AM  Call this number if you have any questions or problems the morning of surgery [phone]  DO NOT EAT OR DRINK ANYTHING AFTER MIDNIGHT THE NIGHT PRIOR TO YOUR SURGERY / PROCEDURE.   FOLLOW  ANY ADDITIONAL PRE OP INSTRUCTIONS YOU RECEIVED FROM YOUR SURGEON'S OFFICE!!!   Oral Hygiene is also important to reduce your risk of infection.        Remember - BRUSH YOUR TEETH THE MORNING OF SURGERY WITH YOUR REGULAR TOOTHPASTE  Do NOT smoke after Midnight the night before surgery.   STOP TAKING all  Vitamins, Herbs and supplements 1 week before your surgery.   ASPIRIN- Do not take 5-7 days before your surgery.   MOUNJARO- DO NOT inject for 10-14 days before surgery.  Last injection will be on Wednesday  November 12, 2023  METFORMIN-  Day BEFORE surgery take as usual. DO NOT TAKE THE DAY OF YOUR SURGERY.   Take ONLY these medicines the morning of surgery with A SIP OF WATER: omeprazole, amlodipine .   DO NOT TAKE Losartan  / HCTZ, or Lovaza  the morning of surgery.                     You may not have any metal on your body including jewelry, and body piercing  Do not wear lotions, powders, cologne, or deodorant  Men may shave face and neck.  Contacts, Hearing Aids, dentures or bridgework may not be worn into surgery. DENTURES WILL BE REMOVED PRIOR TO SURGERY PLEASE DO NOT APPLY Poly grip OR ADHESIVES!!!  You may bring a small overnight bag with you on the day of surgery, only pack items that are not valuable. [facility] IS NOT RESPONSIBLE   FOR VALUABLES THAT ARE LOST OR STOLEN.   Patients discharged on the day of surgery will not be allowed to drive home.  Someone NEEDS to stay with you for the first 24 hours after anesthesia.  Do not bring your home medications to the hospital.  The Pharmacy will dispense medications listed on your medication list to you during your admission in the Hospital.  Please read over the following fact sheets you were given: IF YOU HAVE QUESTIONS ABOUT YOUR PRE-OP INSTRUCTIONS, PLEASE CALL [phone].   [facility] - Preparing for Surgery Before surgery, you can play an important role.  Because skin is not sterile, your skin needs to be as free of germs as possible.  You can reduce the number of germs on your skin by washing with CHG (chlorahexidine gluconate) soap before surgery.  CHG is an antiseptic cleaner which kills germs and bonds with the skin to continue killing germs even after washing. Please DO NOT use if you have an allergy to CHG or  antibacterial soaps.  If your skin becomes reddened/irritated stop using the CHG and inform your nurse when you arrive at Short Stay. Do not shave (including legs and underarms) for at least 48 hours prior to the first CHG shower.  You may shave your face/neck.  Please follow these instructions carefully:  1.  Shower with CHG Soap the night before surgery and the  morning of surgery.  2.  If you choose to wash your hair, wash your hair first as usual with your normal  shampoo.  3.  After you shampoo, rinse your hair and body thoroughly to remove the shampoo.                             4.  Use CHG as you would any other liquid soap.  You can apply chg directly to the skin and wash.  Gently with a scrungie or clean washcloth.  5.  Apply the CHG Soap to your body ONLY FROM THE NECK DOWN.   Do not use on face/ open                           Wound or open sores. Avoid contact with eyes, ears mouth and genitals (private parts).                       Wash face,  Genitals (private parts) with your normal soap.             6.  Wash thoroughly, paying special attention to the area where your  surgery  will be performed.  7.  Thoroughly rinse your body with warm water from the neck down.  8.  DO NOT shower/wash with your normal soap after using and rinsing off the CHG Soap.            9.  Pat yourself dry with a clean towel.            10.  Wear clean pajamas.            11.  Place clean sheets on your bed the night of your first shower and do not  sleep with pets.  ON THE DAY OF SURGERY : Do not apply any lotions/deodorants the morning of surgery.  Please wear clean clothes to the hospital/surgery center.    FAILURE TO FOLLOW THESE INSTRUCTIONS MAY RESULT IN THE CANCELLATION OF YOUR SURGERY  PATIENT SIGNATURE_________________________________  NURSE SIGNATURE__________________________________  ________________________________________________________________________

## 2023-11-10 ENCOUNTER — Encounter (HOSPITAL_COMMUNITY): Payer: Self-pay | Admitting: Medical

## 2023-11-10 ENCOUNTER — Encounter (HOSPITAL_COMMUNITY): Payer: Self-pay

## 2023-11-10 ENCOUNTER — Other Ambulatory Visit: Payer: Self-pay

## 2023-11-10 ENCOUNTER — Telehealth: Payer: Self-pay | Admitting: Cardiology

## 2023-11-10 ENCOUNTER — Encounter (HOSPITAL_COMMUNITY)
Admission: RE | Admit: 2023-11-10 | Discharge: 2023-11-10 | Disposition: A | Discharge status: Home / Self Care | Source: Ambulatory Visit | Attending: Urology | Admitting: Urology

## 2023-11-10 VITALS — BP 118/72 | HR 78 | Temp 98.1°F | Resp 16 | Ht 73.0 in | Wt 239.0 lb

## 2023-11-10 DIAGNOSIS — I10 Essential (primary) hypertension: Secondary | ICD-10-CM | POA: Diagnosis not present

## 2023-11-10 DIAGNOSIS — R3914 Feeling of incomplete bladder emptying: Secondary | ICD-10-CM | POA: Insufficient documentation

## 2023-11-10 DIAGNOSIS — K219 Gastro-esophageal reflux disease without esophagitis: Secondary | ICD-10-CM | POA: Insufficient documentation

## 2023-11-10 DIAGNOSIS — R011 Cardiac murmur, unspecified: Secondary | ICD-10-CM | POA: Insufficient documentation

## 2023-11-10 DIAGNOSIS — Z01818 Encounter for other preprocedural examination: Secondary | ICD-10-CM | POA: Insufficient documentation

## 2023-11-10 DIAGNOSIS — I2510 Atherosclerotic heart disease of native coronary artery without angina pectoris: Secondary | ICD-10-CM | POA: Insufficient documentation

## 2023-11-10 DIAGNOSIS — N35911 Unspecified urethral stricture, male, meatal: Secondary | ICD-10-CM | POA: Diagnosis not present

## 2023-11-10 DIAGNOSIS — M1990 Unspecified osteoarthritis, unspecified site: Secondary | ICD-10-CM | POA: Insufficient documentation

## 2023-11-10 DIAGNOSIS — R339 Retention of urine, unspecified: Secondary | ICD-10-CM | POA: Insufficient documentation

## 2023-11-10 DIAGNOSIS — E119 Type 2 diabetes mellitus without complications: Secondary | ICD-10-CM | POA: Diagnosis not present

## 2023-11-10 HISTORY — DX: Gastro-esophageal reflux disease without esophagitis: K21.9

## 2023-11-10 HISTORY — DX: Prediabetes: R73.03

## 2023-11-10 HISTORY — DX: Unspecified osteoarthritis, unspecified site: M19.90

## 2023-11-10 HISTORY — DX: Cardiac murmur, unspecified: R01.1

## 2023-11-10 LAB — HEMOGLOBIN A1C
Hgb A1c MFr Bld: 5.4 % (ref 4.8–5.6)
Mean Plasma Glucose: 108.28 mg/dL

## 2023-11-10 LAB — CBC
HCT: 43.9 % (ref 39.0–52.0)
Hemoglobin: 14.6 g/dL (ref 13.0–17.0)
MCH: 27.8 pg (ref 26.0–34.0)
MCHC: 33.3 g/dL (ref 30.0–36.0)
MCV: 83.6 fL (ref 80.0–100.0)
Platelets: 302 K/uL (ref 150–400)
RBC: 5.25 MIL/uL (ref 4.22–5.81)
RDW: 13.2 % (ref 11.5–15.5)
WBC: 8 K/uL (ref 4.0–10.5)
nRBC: 0 % (ref 0.0–0.2)

## 2023-11-10 LAB — GLUCOSE, CAPILLARY: Glucose-Capillary: 151 mg/dL — ABNORMAL HIGH (ref 70–99)

## 2023-11-10 NOTE — Telephone Encounter (Signed)
 Patient wants a provider switch from Dr. Edwyna in [place] to Dr. Ladona in [place].

## 2023-11-10 NOTE — Telephone Encounter (Signed)
 I am good with it

## 2023-11-12 ENCOUNTER — Encounter (HOSPITAL_COMMUNITY): Payer: Self-pay

## 2023-11-12 NOTE — Progress Notes (Addendum)
 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  Patient follows with Cardiology for CAD. In 2021 he had an ischemic w/u. Stress test showed ischemia so he had a coronary CTA which showed moderate nonobstructive CAD. Medical therapy recommended. Echo showed normal LVEF with no valvular abnormality. Patient was last seen in clinic on 05/17/22. He was stable at that visit. Advised f/u in 9 months but lost to f/u.   Seen by PCP on 08/18/23. PCP notes requested.  LD Mounjaro: 7/30  VS: BP 118/72 Comment: right arm sitting  Pulse 78   Temp 36.7 C (Oral)   Resp 16   Ht 6' 1 (1.854 m)   Wt 108.4 kg   SpO2 99%   BMI 31.53 kg/m   PROVIDERS: Fernand Tracey LABOR, MD   LABS: Labs reviewed: Acceptable for surgery. BMP hemolyzed, redraw DOS (all labs ordered are listed, but only abnormal results are displayed)  Labs Reviewed  GLUCOSE, CAPILLARY - Abnormal; Notable for the following components:      Result Value   Glucose-Capillary 151 (*)    All other components within normal limits  HEMOGLOBIN A1C  CBC     IMAGES:   EKG 11/10/23:  Normal sinus rhythm, rate 79 Incomplete right bundle branch block Minimal voltage criteria for LVH, may be normal variant ( R in aVL )  CV:  Coronary CTA + FFR 09/27/2019:   IMPRESSION: 1. Moderate CAD in the mid and distal RCA, CADRADS = 3. CT FFR will be performed and reported  separately.   2. Coronary calcium  score is 690, which places the patient in the 98th percentile for age and sex matched control.   3. Normal coronary origin with right dominance.   Stress test 08/18/2019:  Nuclear stress EF: 45%. The left ventricular ejection fraction is mildly decreased (45-54%). There was no ST segment deviation noted during stress. Defect 1: There is a small defect of mild severity present in the basal inferior and mid inferior location. Findings consistent with ischemia. This is an intermediate risk study. Mild ischemia involving basal and mid portion of the inferior wall. Mildly reduced EF  Echo 07/26/2019:  IMPRESSIONS    1. Left ventricular ejection fraction, by estimation, is 55 to 60%. The left ventricle has normal function. The left ventricle has no regional wall motion abnormalities. There is mild concentric left ventricular hypertrophy. Left ventricular diastolic parameters were normal.  2. Right ventricular systolic function is normal. The right ventricular size is normal. There is normal pulmonary artery systolic pressure.  3. The mitral valve is normal in structure. Trivial mitral valve regurgitation. No evidence of mitral stenosis.  4. The aortic valve is normal in structure. Aortic valve regurgitation is not visualized. No aortic stenosis is present.  5. The inferior vena cava is normal in size with greater than 50% respiratory variability, suggesting right atrial pressure of 3 mmHg.  Comparison(s): No prior Echocardiogram. Past Medical  History:  Diagnosis Date   Abnormal nuclear stress test 08/24/2019   Arthritis    Coronary atherosclerosis 06/18/2019   Essential hypertension 06/01/2020   GERD (gastroesophageal reflux disease)    Headache 08/03/2020   Heart murmur    Murmur 2/6 systolic at [place] per Dr. Posey note 05-17-22   Mixed hypercholesterolemia and hypertriglyceridemia    Obesity (BMI 30.0-34.9) 06/01/2020   Pre-diabetes     Recurrent UTI     Past Surgical History:  Procedure Laterality Date   HYPOSPADIAS CORRECTION     KNEE ARTHROSCOPY Left 2023   repaired Meniscus   LUMBAR DISC SURGERY  2005   L4-5 left sded microdiscectomy    MEDICATIONS:  amLODipine  (NORVASC ) 10 MG tablet   Ashwagandha 500 MG CAPS   aspirin EC 81 MG tablet   Cinnamon 500 MG capsule   L-Lysine 500 MG CAPS   losartan -hydrochlorothiazide (HYZAAR) 50-12.5 MG tablet   metFORMIN (GLUCOPHAGE-XR) 750 MG 24 hr tablet   Multiple Vitamins-Minerals (MULTIVITAMIN WITH MINERALS) tablet   nitrofurantoin, macrocrystal-monohydrate, (MACROBID) 100 MG capsule   omega-3 acid ethyl esters (LOVAZA ) 1 g capsule   omeprazole (PRILOSEC) 40 MG capsule   tadalafil (CIALIS) 5 MG tablet   testosterone  cypionate (DEPOTESTOSTERONE CYPIONATE) 200 MG/ML injection   tirzepatide (MOUNJARO) 7.5 MG/0.5ML Pen   No current facility-administered medications for this encounter.    Burnard CHRISTELLA Odis DEVONNA [facility] Short Stay/Anesthesiology [facility] Phone [phone] 11/12/2023 2:18 PM

## 2023-11-26 ENCOUNTER — Encounter (HOSPITAL_COMMUNITY): Admission: RE | Payer: Self-pay | Source: Ambulatory Visit

## 2023-11-26 ENCOUNTER — Ambulatory Visit (HOSPITAL_COMMUNITY): Admission: RE | Admit: 2023-11-26 | Source: Ambulatory Visit | Admitting: Urology

## 2023-11-26 SURGERY — CYSTOURETHROSCOPY, WITH URETHRAL STRICTURE DILATION USING DRUG-COATED BALLOON
Anesthesia: General

## 2023-12-05 ENCOUNTER — Encounter: Payer: Self-pay | Admitting: Cardiology

## 2023-12-05 ENCOUNTER — Ambulatory Visit: Attending: Cardiology | Admitting: Cardiology

## 2023-12-05 ENCOUNTER — Ambulatory Visit: Admitting: Cardiology

## 2023-12-05 ENCOUNTER — Other Ambulatory Visit (HOSPITAL_COMMUNITY): Payer: Self-pay

## 2023-12-05 VITALS — BP 122/75 | HR 80 | Resp 16 | Ht 73.0 in | Wt 238.0 lb

## 2023-12-05 DIAGNOSIS — I10 Essential (primary) hypertension: Secondary | ICD-10-CM | POA: Diagnosis not present

## 2023-12-05 DIAGNOSIS — R931 Abnormal findings on diagnostic imaging of heart and coronary circulation: Secondary | ICD-10-CM

## 2023-12-05 DIAGNOSIS — E1169 Type 2 diabetes mellitus with other specified complication: Secondary | ICD-10-CM

## 2023-12-05 DIAGNOSIS — E782 Mixed hyperlipidemia: Secondary | ICD-10-CM | POA: Diagnosis not present

## 2023-12-05 MED ORDER — ATORVASTATIN CALCIUM 10 MG PO TABS
10.0000 mg | ORAL_TABLET | Freq: Every day | ORAL | 0 refills | Status: DC
  Filled 2023-12-05: qty 90, 90d supply, fill #0

## 2023-12-05 MED ORDER — EZETIMIBE 10 MG PO TABS
10.0000 mg | ORAL_TABLET | Freq: Every day | ORAL | 0 refills | Status: AC
  Filled 2023-12-05: qty 90, 90d supply, fill #0

## 2023-12-05 NOTE — Progress Notes (Signed)
 Cardiology Office Note:  .   Date:  12/06/2023  ID:  Ethan Chapman, DOB 1966-02-14, MRN [id number] PCP: Ethan Tracey DELENA, MD  [facility] Providers Cardiologist:  Ethan Bergamo, MD   History of Present Illness: Ethan   [name] is a 58 y.o. Caucasian male patient with coronary calcium  score of 690 in the 98th percentile on 10/02/2019 and CT revealing very mild disease in the coronary arteries, CT FFR negative, hypertension, mixed hyperlipidemia, obesity presents to establish care, previously being followed by Ethan Chapman.  He has had a mildly abnormal nuclear stress test revealing inferior ischemia on 08/18/2019 leading to coronary CTA.  Echocardiogram at that time revealed normal LVEF without significant valvular abnormality.  Cardiac Studies relevent.        Discussed the use of AI scribe software for clinical note transcription with the patient, who gave verbal consent to proceed.  History of Present Illness Ethan Chapman is a 58 year old male with a history of elevated coronary calcium  score who presents for cardiovascular follow-up.  He has no current cardiovascular symptoms and denies chest pain. A coronary CT angiogram in 2021 showed a coronary calcium  score of 690, placing him at the 98th percentile for calcium  buildup. He recalls a past finding of a small blockage but has not experienced significant heart problems.  He discontinued Revastatin several months ago after normal blood work results and reports increased energy and no leg discomfort since stopping the medication. He continues to take Cialis and is cautious with nitroglycerin  use. He is currently on losartan  and amlodipine  for blood pressure management, taking 5 mg of amlodipine  daily after cutting a 10 mg pill in half.  He is diabetic, taking Mounjaro and metformin, and has lost approximately 30 pounds, reducing his weight from 260 to 236 pounds. He follows a carnivore diet, which may have contributed to elevated  triglycerides. He takes Mounjaro 7.5 mg weekly and metformin twice daily.  He has a family history of dementia and Alzheimer's, which concerns him regarding cholesterol medication use. He quit chewing tobacco after back surgery due to concerns about nicotine affecting bone healing. He does not smoke and has a history of chewing tobacco extensively in the past.   Labs    Lab Results  Component Value Date   HGBA1C 5.4 11/10/2023     Care everywhere/Faxed External Labs:  Labs 11/02/2023:  A1c 5.4%.  Hb 14.6/HCT 43.9, platelets 392.  Serum testosterone  markedly reduced at 141.  Labs 10/09/2022:  Serum glucose 138 mg, BUN 10, creatinine 0.88, eGFR 101 mL, potassium 4.5, LFTs normal.  Lipid profile 06/06/2020:  Total cholesterol 143, triglycerides 139, HDL 35, LDL 83.  ROS  Review of Systems  Cardiovascular:  Negative for chest pain, dyspnea on exertion and leg swelling.   Physical Exam:   VS:  BP 122/75 (BP Location: Left Arm, Patient Position: Sitting, Cuff Size: Normal)   Pulse 80   Resp 16   Ht 6' 1 (1.854 m)   Wt 238 lb (108 kg)   SpO2 97%   BMI 31.40 kg/m    Wt Readings from Last 3 Encounters:  12/05/23 238 lb (108 kg)  11/10/23 239 lb (108.4 kg)  05/17/22 236 lb (107 kg)    BP Readings from Last 3 Encounters:  12/05/23 122/75  11/10/23 118/72  05/17/22 112/66   Physical Exam EKG:       EKG 11/10/2023: Normal sinus rhythm at the rate of 79 beats minute, incomplete right  bundle branch block.  Normal EKG.  ASSESSMENT AND PLAN: .      ICD-10-CM   1. Elevated coronary artery calcium  score of 690 in the 98th percentile on 10/02/2019  R93.1 atorvastatin  (LIPITOR) 10 MG tablet    ezetimibe  (ZETIA ) 10 MG tablet    2. Essential hypertension  I10     3. DM type 2 with diabetic mixed hyperlipidemia (HCC)  E11.69 atorvastatin  (LIPITOR) 10 MG tablet   E78.2 ezetimibe  (ZETIA ) 10 MG tablet     Assessment & Plan Elevated coronary artery calcium  score with mild  coronary atherosclerosis Coronary CT angiogram from 2021 showed a coronary calcium  score of 690, placing him at the 98th percentile, indicating a high risk for cardiovascular events. Mild blockages were noted, and stress test abnormality was attributed to a gut artifact rather than ischemia. - Start atorvastatin  10 mg daily. - Advise on lifestyle modifications including weight loss and dietary changes to lower cholesterol. - Monitor cholesterol levels and adjust medication as needed. - In the absence of stenosis of >50%, patient does not have established CAD.  Hyperlipidemia in the setting of type 2 diabetes mellitus and elevated coronary calcium  score LDL is 83 mg/dL, above the target of 55 mg/dL for his risk profile. Triglycerides are elevated at 300 mg/dL, likely due to dietary habits, specifically high intake of red meat. Aggressive lipid management is necessary due to type 2 diabetes and elevated coronary calcium  score. - Start atorvastatin  10 mg daily and Zetia  for cholesterol-lowering medication to target both liver production and gut absorption of cholesterol. - Advise dietary changes to reduce intake of red meat and increase consumption of chicken and fish.  Patient and his wife have switched over to carnivore's diet for weight loss. - Monitor lipid profile and adjust treatment as necessary.  Hypertension Blood pressure is well-controlled on current medications, including amlodipine  and losartan  with hydrochlorothiazide. Weight loss may allow for reduction in antihypertensive medication dosage. - Continue current antihypertensive regimen with amlodipine  5 mg daily and losartan  with hydrochlorothiazide 50/12.5 mg daily. - Consider reducing antihypertensive medication dosage if significant weight loss is achieved.  Type 2 diabetes mellitus Type 2 diabetes is managed with metformin and Mounjaro. Weight loss has been achieved, benefiting glycemic control. - Continue metformin and Mounjaro as  prescribed. - Encourage continued weight loss and dietary modifications to improve glycemic control.   Follow up: As needed, patient reassured, he is presently doing well and has lost about 30 pounds in weight since being on Mounjaro and trying to make lifestyle changes.  He only needs primary prevention strategy with good control of LDL, improvement in triglycerides and continue present antihypertensive medication management.  No further cardiac testing is indicated.  Signed,  Ethan Bergamo, MD, [facility] 12/06/2023, 9:29 AM [facility] [address] [place], [place] [address] Phone: [phone]. Fax:  [phone]

## 2023-12-05 NOTE — Patient Instructions (Signed)
 Medication Instructions:  Your physician recommends that you continue on your current medications as directed. Please refer to the Current Medication list given to you today.  *If you need a refill on your cardiac medications before your next appointment, please call your pharmacy*  Lab Work: none If you have labs (blood work) drawn today and your tests are completely normal, you will receive your results only by: MyChart Message (if you have MyChart) OR A paper copy in the mail If you have any lab test that is abnormal or we need to change your treatment, we will call you to review the results.  Testing/Procedures: none  Follow-Up: At [facility], you and your health needs are our priority.  As part of our continuing mission to provide you with exceptional heart care, our providers are all part of one team.  This team includes your primary Cardiologist (physician) and Advanced Practice Providers or APPs (Physician Assistants and Nurse Practitioners) who all work together to provide you with the care you need, when you need it.  Your next appointment:   As needed  Provider:   Knox Perl, MD    We recommend signing up for the patient portal called "MyChart".  Sign up information is provided on this After Visit Summary.  MyChart is used to connect with patients for Virtual Visits (Telemedicine).  Patients are able to view lab/test results, encounter notes, upcoming appointments, etc.  Non-urgent messages can be sent to your provider as well.   To learn more about what you can do with MyChart, go to [email or web address].

## 2023-12-05 NOTE — Progress Notes (Deleted)
  Cardiology Office Note:  .   Date:  12/05/2023  ID:  Ethan Chapman, DOB [birth year], MRN [id number] PCP: Ethan Tracey DELENA, MD  [facility] Providers Cardiologist:  None { Click to update primary MD,subspecialty MD or APP then REFRESH:1}  History of Present Illness: Ethan Chapman is a 58 y.o. Caucasian male patient with coronary calcium  score of 690 in the 98th percentile on 10/02/2019 and CT revealing very mild disease in the coronary arteries, CT FFR negative, hypertension, mixed hyperlipidemia, obesity presents to establish care, previously being followed by Dr. Revankar.  He has had a mildly abnormal nuclear stress test revealing inferior ischemia on 08/18/2019 leading to coronary CTA.  Echocardiogram at that time revealed normal LVEF without significant valvular abnormality.  Cardiac Studies relevent.        Discussed the use of AI scribe software for clinical note transcription with the patient, who gave verbal consent to proceed.  History of Present Illness    Labs    Lab Results  Component Value Date   HGBA1C 5.4 11/10/2023     Care everywhere/Faxed External Labs:  Labs 11/02/2022:  A1c 5.4%.  Hb 14.6/HCT 43.9, platelets 392.  Serum testosterone  markedly reduced at 04/16/1939.  Labs 10/09/2022:  Serum glucose 138 mg, BUN 10, creatinine 0.88, eGFR 101 mL, potassium 4.5, LFTs normal.  Lipid profile 06/06/2020:  Total cholesterol 143, triglycerides 139, HDL 35, LDL 83.  ROS  ***ROS Physical Exam:   VS:  There were no vitals taken for this visit.   Wt Readings from Last 3 Encounters:  11/10/23 239 lb (108.4 kg)  05/17/22 236 lb (107 kg)  05/09/21 257 lb (116.6 kg)    BP Readings from Last 3 Encounters:  11/10/23 118/72  05/17/22 112/66  05/09/21 120/80   ***Physical Exam EKG:         ASSESSMENT AND PLAN: .      ICD-10-CM   1. Atherosclerosis of native coronary artery of native heart without angina pectoris  I25.10     2. Essential  hypertension  I10     3. Mixed hypercholesterolemia and hypertriglyceridemia  E78.2       Assessment and Plan Assessment & Plan    Follow up: ***  Signed,  Gordy Bergamo, MD, [facility] 12/05/2023, 7:55 AM [facility] [address] [place], [place] [address] Phone: [phone]. Fax:  [phone]

## 2023-12-17 DIAGNOSIS — E291 Testicular hypofunction: Secondary | ICD-10-CM | POA: Diagnosis not present

## 2023-12-17 DIAGNOSIS — E782 Mixed hyperlipidemia: Secondary | ICD-10-CM | POA: Diagnosis not present

## 2023-12-17 DIAGNOSIS — E1169 Type 2 diabetes mellitus with other specified complication: Secondary | ICD-10-CM | POA: Diagnosis not present

## 2023-12-17 DIAGNOSIS — Z79899 Other long term (current) drug therapy: Secondary | ICD-10-CM | POA: Diagnosis not present

## 2023-12-17 DIAGNOSIS — Z125 Encounter for screening for malignant neoplasm of prostate: Secondary | ICD-10-CM | POA: Diagnosis not present

## 2023-12-19 DIAGNOSIS — I10 Essential (primary) hypertension: Secondary | ICD-10-CM | POA: Diagnosis not present

## 2023-12-19 DIAGNOSIS — E291 Testicular hypofunction: Secondary | ICD-10-CM | POA: Diagnosis not present

## 2023-12-19 DIAGNOSIS — E1169 Type 2 diabetes mellitus with other specified complication: Secondary | ICD-10-CM | POA: Diagnosis not present

## 2023-12-19 DIAGNOSIS — E782 Mixed hyperlipidemia: Secondary | ICD-10-CM | POA: Diagnosis not present

## 2023-12-24 ENCOUNTER — Ambulatory Visit: Admitting: Cardiology

## 2024-01-05 DIAGNOSIS — N3020 Other chronic cystitis without hematuria: Secondary | ICD-10-CM | POA: Diagnosis not present

## 2024-01-05 DIAGNOSIS — R3912 Poor urinary stream: Secondary | ICD-10-CM | POA: Diagnosis not present

## 2024-01-05 DIAGNOSIS — R3913 Splitting of urinary stream: Secondary | ICD-10-CM | POA: Diagnosis not present

## 2024-02-02 ENCOUNTER — Encounter: Payer: Self-pay | Admitting: Cardiology

## 2024-02-02 DIAGNOSIS — R931 Abnormal findings on diagnostic imaging of heart and coronary circulation: Secondary | ICD-10-CM

## 2024-02-02 DIAGNOSIS — E782 Mixed hyperlipidemia: Secondary | ICD-10-CM

## 2024-02-02 MED ORDER — ROSUVASTATIN CALCIUM 10 MG PO TABS: 10.0000 mg | ORAL_TABLET | Freq: Every day | ORAL | 0 refills | Status: DC

## 2024-02-02 NOTE — Telephone Encounter (Signed)
 ICD-10-CM   1. Elevated coronary artery calcium  score  R93.1 rosuvastatin  (CRESTOR ) 10 MG tablet    2. Mixed hypercholesterolemia and hypertriglyceridemia  E78.2 rosuvastatin  (CRESTOR ) 10 MG tablet     Meds ordered this encounter  Medications   rosuvastatin  (CRESTOR ) 10 MG tablet    Sig: Take 1 tablet (10 mg total) by mouth daily.    Dispense:  90 tablet    Refill:  0    Refills to Dr. Jaber Khan    Medications Discontinued During This Encounter  Medication Reason   atorvastatin  (LIPITOR) 10 MG tablet Side effect (s)

## 2024-03-29 DIAGNOSIS — N390 Urinary tract infection, site not specified: Secondary | ICD-10-CM | POA: Diagnosis not present

## 2024-03-29 DIAGNOSIS — N401 Enlarged prostate with lower urinary tract symptoms: Secondary | ICD-10-CM | POA: Diagnosis not present

## 2024-03-29 DIAGNOSIS — Z6830 Body mass index (BMI) 30.0-30.9, adult: Secondary | ICD-10-CM | POA: Diagnosis not present

## 2024-04-29 ENCOUNTER — Other Ambulatory Visit: Payer: Self-pay | Admitting: Cardiology

## 2024-04-29 DIAGNOSIS — R931 Abnormal findings on diagnostic imaging of heart and coronary circulation: Secondary | ICD-10-CM

## 2024-04-29 DIAGNOSIS — E782 Mixed hyperlipidemia: Secondary | ICD-10-CM

## 2024-04-30 ENCOUNTER — Other Ambulatory Visit: Payer: Self-pay | Admitting: Cardiology

## 2024-04-30 DIAGNOSIS — R931 Abnormal findings on diagnostic imaging of heart and coronary circulation: Secondary | ICD-10-CM

## 2024-04-30 DIAGNOSIS — E782 Mixed hyperlipidemia: Secondary | ICD-10-CM

## 2024-04-30 NOTE — Telephone Encounter (Signed)
 In accordance with refill protocols, please review and address the following requirements before this medication refill can be authorized:  Labs
